# Patient Record
Sex: Female | Born: 1975 | Race: White | Hispanic: No | Marital: Married | Smoking: Current every day smoker
Health system: Southern US, Community
[De-identification: ages and names within clinical notes are randomized; demographics above are authoritative.]

## PROBLEM LIST (undated history)

## (undated) DIAGNOSIS — G932 Benign intracranial hypertension: Secondary | ICD-10-CM

## (undated) DIAGNOSIS — R911 Solitary pulmonary nodule: Secondary | ICD-10-CM

## (undated) DIAGNOSIS — F1010 Alcohol abuse, uncomplicated: Secondary | ICD-10-CM

## (undated) DIAGNOSIS — R5383 Other fatigue: Secondary | ICD-10-CM

## (undated) DIAGNOSIS — H4710 Unspecified papilledema: Secondary | ICD-10-CM

## (undated) DIAGNOSIS — J449 Chronic obstructive pulmonary disease, unspecified: Secondary | ICD-10-CM

## (undated) DIAGNOSIS — G2581 Restless legs syndrome: Secondary | ICD-10-CM

## (undated) DIAGNOSIS — F1911 Other psychoactive substance abuse, in remission: Secondary | ICD-10-CM

## (undated) DIAGNOSIS — K589 Irritable bowel syndrome without diarrhea: Secondary | ICD-10-CM

## (undated) DIAGNOSIS — F419 Anxiety disorder, unspecified: Secondary | ICD-10-CM

## (undated) HISTORY — PX: CHOLECYSTECTOMY: SHX55

## (undated) HISTORY — DX: Other psychoactive substance abuse, in remission: F19.11

## (undated) HISTORY — DX: Chronic obstructive pulmonary disease, unspecified: J44.9

## (undated) HISTORY — DX: Anxiety disorder, unspecified: F41.9

## (undated) HISTORY — PX: TUBAL LIGATION: SHX77

## (undated) HISTORY — PX: DENTAL SURGERY: SHX609

## (undated) HISTORY — DX: Alcohol abuse, uncomplicated: F10.10

## (undated) HISTORY — DX: Other fatigue: R53.83

## (undated) HISTORY — DX: Unspecified papilledema: H47.10

## (undated) HISTORY — DX: Irritable bowel syndrome, unspecified: K58.9

## (undated) HISTORY — DX: Restless legs syndrome: G25.81

## (undated) HISTORY — DX: Benign intracranial hypertension: G93.2

## (undated) HISTORY — PX: OTHER SURGICAL HISTORY: SHX169

## (undated) HISTORY — DX: Solitary pulmonary nodule: R91.1

---

## 2018-09-09 ENCOUNTER — Emergency Department (HOSPITAL_COMMUNITY)
Admission: EM | Admit: 2018-09-09 | Discharge: 2018-09-09 | Disposition: A | Discharge status: Home / Self Care | Payer: Medicaid - Out of State | Attending: Emergency Medicine | Admitting: Emergency Medicine

## 2018-09-09 ENCOUNTER — Encounter (HOSPITAL_COMMUNITY): Payer: Self-pay

## 2018-09-09 ENCOUNTER — Other Ambulatory Visit: Payer: Self-pay

## 2018-09-09 DIAGNOSIS — X17XXXA Contact with hot engines, machinery and tools, initial encounter: Secondary | ICD-10-CM | POA: Insufficient documentation

## 2018-09-09 DIAGNOSIS — Y9289 Other specified places as the place of occurrence of the external cause: Secondary | ICD-10-CM | POA: Insufficient documentation

## 2018-09-09 DIAGNOSIS — Y9389 Activity, other specified: Secondary | ICD-10-CM | POA: Insufficient documentation

## 2018-09-09 DIAGNOSIS — F17210 Nicotine dependence, cigarettes, uncomplicated: Secondary | ICD-10-CM | POA: Insufficient documentation

## 2018-09-09 DIAGNOSIS — Y998 Other external cause status: Secondary | ICD-10-CM | POA: Insufficient documentation

## 2018-09-09 DIAGNOSIS — R197 Diarrhea, unspecified: Secondary | ICD-10-CM | POA: Diagnosis not present

## 2018-09-09 DIAGNOSIS — T24232A Burn of second degree of left lower leg, initial encounter: Secondary | ICD-10-CM | POA: Diagnosis present

## 2018-09-09 DIAGNOSIS — R112 Nausea with vomiting, unspecified: Secondary | ICD-10-CM | POA: Diagnosis not present

## 2018-09-09 DIAGNOSIS — T300 Burn of unspecified body region, unspecified degree: Secondary | ICD-10-CM

## 2018-09-09 HISTORY — DX: Benign intracranial hypertension: G93.2

## 2018-09-09 LAB — COMPREHENSIVE METABOLIC PANEL
ALT: 9 U/L (ref 0–44)
AST: 13 U/L — ABNORMAL LOW (ref 15–41)
Albumin: 3.1 g/dL — ABNORMAL LOW (ref 3.5–5.0)
Alkaline Phosphatase: 91 U/L (ref 38–126)
Anion gap: 7 (ref 5–15)
BUN: <5 mg/dL — ABNORMAL LOW (ref 6–20)
CO2: 26 mmol/L (ref 22–32)
Calcium: 7.6 mg/dL — ABNORMAL LOW (ref 8.9–10.3)
Chloride: 105 mmol/L (ref 98–111)
Creatinine, Ser: 0.67 mg/dL (ref 0.44–1.00)
GFR calc Af Amer: >60 mL/min (ref >60)
GFR calc non Af Amer: >60 mL/min (ref >60)
Glucose, Bld: 100 mg/dL — ABNORMAL HIGH (ref 70–99)
Potassium: 3.1 mmol/L — ABNORMAL LOW (ref 3.5–5.1)
Sodium: 138 mmol/L (ref 135–145)
Total Bilirubin: 0.5 mg/dL (ref 0.3–1.2)
Total Protein: 6 g/dL — ABNORMAL LOW (ref 6.5–8.1)

## 2018-09-09 LAB — CBC WITH DIFFERENTIAL/PLATELET
Abs Immature Granulocytes: 0.03 10*3/uL (ref 0.00–0.07)
Basophils Absolute: 0 10*3/uL (ref 0.0–0.1)
Basophils Relative: 0 %
Eosinophils Absolute: 0.2 10*3/uL (ref 0.0–0.5)
Eosinophils Relative: 2 %
HCT: 36.4 % (ref 36.0–46.0)
Hemoglobin: 11.2 g/dL — ABNORMAL LOW (ref 12.0–15.0)
Immature Granulocytes: 0 %
Lymphocytes Relative: 24 %
Lymphs Abs: 1.9 10*3/uL (ref 0.7–4.0)
MCH: 27 pg (ref 26.0–34.0)
MCHC: 30.8 g/dL (ref 30.0–36.0)
MCV: 87.7 fL (ref 80.0–100.0)
Monocytes Absolute: 0.4 10*3/uL (ref 0.1–1.0)
Monocytes Relative: 5 %
Neutro Abs: 5.5 10*3/uL (ref 1.7–7.7)
Neutrophils Relative %: 69 %
Platelets: 301 10*3/uL (ref 150–400)
RBC: 4.15 MIL/uL (ref 3.87–5.11)
RDW: 16.5 % — ABNORMAL HIGH (ref 11.5–15.5)
WBC: 7.9 10*3/uL (ref 4.0–10.5)
nRBC: 0 % (ref 0.0–0.2)

## 2018-09-09 LAB — URINALYSIS, ROUTINE W REFLEX MICROSCOPIC
Bacteria, UA: NONE SEEN
Bilirubin Urine: NEGATIVE
Glucose, UA: NEGATIVE mg/dL
Ketones, ur: NEGATIVE mg/dL
Leukocytes,Ua: NEGATIVE
Nitrite: NEGATIVE
Protein, ur: NEGATIVE mg/dL
Specific Gravity, Urine: 1.002 — ABNORMAL LOW (ref 1.005–1.030)
pH: 7 (ref 5.0–8.0)

## 2018-09-09 LAB — LIPASE, BLOOD: Lipase: 31 U/L (ref 11–51)

## 2018-09-09 MED ORDER — POTASSIUM CHLORIDE CRYS ER 20 MEQ PO TBCR
20.0000 meq | EXTENDED_RELEASE_TABLET | Freq: Once | ORAL | Status: AC
  Administered 2018-09-09: 20 meq via ORAL
  Filled 2018-09-09: qty 1

## 2018-09-09 MED ORDER — HYDROCODONE-ACETAMINOPHEN 5-325 MG PO TABS: 1.0000 | ORAL_TABLET | ORAL | 0 refills | Status: DC | PRN

## 2018-09-09 MED ORDER — SODIUM CHLORIDE 0.9 % IV BOLUS
1000.0000 mL | Freq: Once | INTRAVENOUS | Status: AC
  Administered 2018-09-09: 1000 mL via INTRAVENOUS

## 2018-09-09 MED ORDER — SILVER SULFADIAZINE 1 % EX CREA
TOPICAL_CREAM | Freq: Once | CUTANEOUS | Status: AC
  Administered 2018-09-09: 1 via TOPICAL
  Filled 2018-09-09: qty 50
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
## 2018-09-09 NOTE — ED Triage Notes (Signed)
Pt reports she has chronic diarrhea and vomiting frequently.  Pt says is seeing a GI specialist in [place].   Says they are checking her pancreas.
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
## 2018-11-02 ENCOUNTER — Emergency Department (HOSPITAL_COMMUNITY): Payer: Medicaid - Out of State

## 2018-11-02 ENCOUNTER — Emergency Department (HOSPITAL_COMMUNITY)
Admission: EM | Admit: 2018-11-02 | Discharge: 2018-11-02 | Disposition: A | Discharge status: Home / Self Care | Payer: Medicaid - Out of State | Attending: Emergency Medicine | Admitting: Emergency Medicine

## 2018-11-02 ENCOUNTER — Other Ambulatory Visit: Payer: Self-pay

## 2018-11-02 ENCOUNTER — Encounter (HOSPITAL_COMMUNITY): Payer: Self-pay

## 2018-11-02 DIAGNOSIS — F17200 Nicotine dependence, unspecified, uncomplicated: Secondary | ICD-10-CM | POA: Insufficient documentation

## 2018-11-02 DIAGNOSIS — M25561 Pain in right knee: Secondary | ICD-10-CM | POA: Diagnosis not present

## 2018-11-02 DIAGNOSIS — Z91040 Latex allergy status: Secondary | ICD-10-CM | POA: Insufficient documentation

## 2018-11-02 DIAGNOSIS — Z79899 Other long term (current) drug therapy: Secondary | ICD-10-CM | POA: Diagnosis not present

## 2018-11-02 MED ORDER — NAPROXEN 500 MG PO TABS: 500.0000 mg | ORAL_TABLET | Freq: Two times a day (BID) | ORAL | 0 refills | Status: DC

## 2018-11-02 MED ORDER — IBUPROFEN 800 MG PO TABS
800.0000 mg | ORAL_TABLET | Freq: Once | ORAL | Status: AC
  Administered 2018-11-02: 800 mg via ORAL
  Filled 2018-11-02: qty 1

## 2018-11-02 NOTE — Discharge Instructions (Signed)
Take naproxen 2 times a day with meals.  Do not take other anti-inflammatories at the same time (Advil, Motrin, ibuprofen, Aleve). You may supplement with Tylenol if you need further pain control. Use ice packs , 20 minutes at a time, 3 times a day.  Use the knee immobilizer and crutches when walking.  Keep your leg elevated.  Follow up with the orthopedic doctor in 1 week if your pain is not improving Return to the ER with any new, worsening, or concerning symptoms.
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
## 2018-11-02 NOTE — ED Provider Notes (Signed)
[facility] EMERGENCY DEPARTMENT Provider Note   CSN: [id number] Arrival date & time: 11/02/18  1925     History   Chief Complaint No chief complaint on file.   HPI Lacey Simon is a 43 y.o. female presenting for evaluation of right knee pain.  Patient states she was going up some stairs yesterday when she felt her knee lock and then pop.  She did not have significant amount of pain or swelling immediately afterwards, however several hours later she had increased pain.  When she woke up this morning, her knee was more swollen and tender.  She has not taken anything for this including Tylenol or ibuprofen.  She denies previous history of knee problems.  She denies numbness or tingling.  Pain is worse with palpation and movement.  Nothing makes it better.  She does not have an orthopedic doctor.  She denies pain or injury elsewhere.     HPI  Past Medical History:  Diagnosis Date  . Pseudotumor cerebri     There are no active problems to display for this patient.   Past Surgical History:  Procedure Laterality Date  . CESAREAN SECTION    . CHOLECYSTECTOMY    . TUBAL LIGATION       OB History   No obstetric history on file.      Home Medications    Prior to Admission medications   Medication Sig Start Date End Date Taking? Authorizing Provider  HYDROcodone-acetaminophen (NORCO/VICODIN) 5-325 MG tablet Take 1 tablet by mouth every 4 (four) hours as needed. 09/09/18   Evalee Jefferson, PA-C  naproxen (NAPROSYN) 500 MG tablet Take 1 tablet (500 mg total) by mouth 2 (two) times daily with a meal. 11/02/18   Trinnity Breunig, PA-C  pramipexole (MIRAPEX) 0.5 MG tablet Take 1 tablet by mouth every evening. 09/02/18   [provider]  promethazine (PHENERGAN) 25 MG tablet Take 1 tablet by mouth 2 (two) times daily as needed. 03/04/18   [provider]  sucralfate (CARAFATE) 1 g tablet Take 1 tablet by mouth 2 (two) times a day. 04/09/18   [provider]    Family  History History reviewed. No pertinent family history.  Social History Social History   Tobacco Use  . Smoking status: Current Every Day Smoker  . Smokeless tobacco: Never Used  Substance Use Topics  . Alcohol use: Never    Frequency: Never  . Drug use: Never     Allergies   Penicillins, Latex, Sulfa antibiotics, and Erythromycin   Review of Systems Review of Systems  Musculoskeletal: Positive for arthralgias and joint swelling.  Hematological: Does not bruise/bleed easily.     Physical Exam Updated Vital Signs BP (!) 145/86 (BP Location: Right Arm)   Pulse 75   Temp 98.3 F (36.8 C) (Oral)   Resp 18   Ht 5\' 3"  (1.6 m)   Wt 61.2 kg   LMP 10/05/2018   SpO2 100%   BMI 23.91 kg/m   Physical Exam Vitals signs and nursing note reviewed.  Constitutional:      General: She is not in acute distress.    Appearance: She is well-developed.  HENT:     Head: Normocephalic and atraumatic.  Neck:     Musculoskeletal: Normal range of motion.  Pulmonary:     Effort: Pulmonary effort is normal.  Abdominal:     General: There is no distension.  Musculoskeletal:        General: Swelling and tenderness present.  Comments: Mild swelling noted of the right knee.  No erythema or warmth.  Tenderness palpation of the anterior and medial knee.  No tenderness palpation of the calf or thigh.  Pedal pulses intact. Decreased range of motion of the knee due to pain.  Increased pain with varus stress.   Skin:    General: Skin is warm.     Capillary Refill: Capillary refill takes less than 2 seconds.     Findings: No rash.  Neurological:     Mental Status: She is alert and oriented to person, place, and time.      ED Treatments / Results  Labs (all labs ordered are listed, but only abnormal results are displayed) Labs Reviewed - No data to display  EKG None  Radiology Dg Knee Complete 4 Views Right  Result Date: 11/02/2018 CLINICAL DATA:  43 year old female with right  knee pain. EXAM: RIGHT KNEE - COMPLETE 4+ VIEW COMPARISON:  None. FINDINGS: No evidence of fracture, dislocation, or joint effusion. No evidence of arthropathy or other focal bone abnormality. Soft tissues are unremarkable. IMPRESSION: Negative. Electronically Signed   By: Elgie CollardArash  Radparvar M.D.   On: 11/02/2018 20:33    Procedures Procedures (including critical care time)  Medications Ordered in ED Medications  ibuprofen (ADVIL) tablet 800 mg (800 mg Oral Given 11/02/18 2140)     Initial Impression / Assessment and Plan / ED Course  I have reviewed the triage vital signs and the nursing notes.  Pertinent labs & imaging results that were available during my care of the patient were reviewed by me and considered in my medical decision making (see chart for details).        Patient presenting for evaluation of knee pain.  Physical exam reassuring, she is neurovascularly intact.  However, his knee is swollen and tender, will obtain x-rays for further evaluation.  X-rays read interpreted by me, no fracture dislocation.  Discussed possibility of strain for sprain versus ligamentous versus meniscal injury.  Discussed symptomatic treatment with knee immobilization, crutches, Tylenol, ibuprofen, rest, ice, elevation.  Encourage follow-up with orthopedics in 1 week if symptoms not improving.  At this time, patient appears safe for discharge.  Return precautions given.  Patient states she understands agrees plan.   Final Clinical Impressions(s) / ED Diagnoses   Final diagnoses:  Acute pain of right knee    ED Discharge Orders         Ordered    naproxen (NAPROSYN) 500 MG tablet  2 times daily with meals     11/02/18 2105           Jakhi Dishman, PA-C 11/02/18 2211    Pricilla LovelessGoldston, Scott, MD 11/02/18 2315

## 2019-12-11 ENCOUNTER — Other Ambulatory Visit: Payer: Self-pay

## 2019-12-11 ENCOUNTER — Ambulatory Visit (INDEPENDENT_AMBULATORY_CARE_PROVIDER_SITE_OTHER): Payer: Self-pay | Admitting: Podiatry

## 2019-12-11 ENCOUNTER — Encounter: Payer: Self-pay | Admitting: Podiatry

## 2019-12-11 DIAGNOSIS — M7752 Other enthesopathy of left foot: Secondary | ICD-10-CM

## 2019-12-11 DIAGNOSIS — Q828 Other specified congenital malformations of skin: Secondary | ICD-10-CM

## 2019-12-15 ENCOUNTER — Encounter: Payer: Self-pay | Admitting: Podiatry

## 2019-12-15 NOTE — Progress Notes (Signed)
Subjective:  Patient ID: Lacey Simon, female    DOB: 1975-12-18,  MRN: [id number]  Chief Complaint  Patient presents with  . Callouses    Bilateral callouses painful when i walk     44 y.o. female presents with the above complaint.  Patient presents with complaint of left submetatarsal 5 porokeratosis.  Patient states it painful to walk on.  She states that she has tried everything to help with it.  She has tried offloading it she is tried over-the-counter insoles.  She is tried Economist modification.  She has not seen anyone else prior to seeing me.  She would like to discuss treatment options.  She denies any other acute complaints.  She has not seen anyone else prior to seeing me.   Review of Systems: Negative except as noted in the HPI. Denies N/V/F/Ch.  Past Medical History:  Diagnosis Date  . Pseudotumor cerebri     Current Outpatient Medications:  .  diclofenac (VOLTAREN) 75 MG EC tablet, Take by mouth., Disp: , Rfl:  .  diphenhydrAMINE (BENADRYL) 25 mg capsule, Take by mouth., Disp: , Rfl:  .  furosemide (LASIX) 20 MG tablet, TAKE 1 TABLET BY MOUTH ONCE DAILY FOR 14 DAYS, Disp: , Rfl:  .  gabapentin (NEURONTIN) 300 MG capsule, Take 1 capsule by mouth in the morning and 2 capsules nightly, Disp: , Rfl:  .  omeprazole (PRILOSEC) 40 MG capsule, Take 1 capsule by mouth daily., Disp: , Rfl:  .  HYDROcodone-acetaminophen (NORCO/VICODIN) 5-325 MG tablet, Take 1 tablet by mouth every 4 (four) hours as needed., Disp: 10 tablet, Rfl: 0 .  naproxen (NAPROSYN) 500 MG tablet, Take 1 tablet (500 mg total) by mouth 2 (two) times daily with a meal., Disp: 21 tablet, Rfl: 0 .  pramipexole (MIRAPEX) 0.5 MG tablet, Take 1 tablet by mouth every evening., Disp: , Rfl:  .  promethazine (PHENERGAN) 25 MG tablet, Take 1 tablet by mouth 2 (two) times daily as needed., Disp: , Rfl:  .  sucralfate (CARAFATE) 1 g tablet, Take 1 tablet by mouth 2 (two) times a day., Disp: , Rfl:   Social History     Tobacco Use  Smoking Status Current Every Day Smoker  Smokeless Tobacco Never Used    Allergies  Allergen Reactions  . Penicillins Shortness Of Breath  . Latex   . Sulfa Antibiotics   . Erythromycin Palpitations   Objective:  There were no vitals filed for this visit. There is no height or weight on file to calculate BMI. Constitutional Well developed. Well nourished.  Vascular Dorsalis pedis pulses palpable bilaterally. Posterior tibial pulses palpable bilaterally. Capillary refill normal to all digits.  No cyanosis or clubbing noted. Pedal hair growth normal.  Neurologic Normal speech. Oriented to person, place, and time. Epicritic sensation to light touch grossly present bilaterally.  Dermatologic Nails well groomed and normal in appearance. No open wounds. No skin lesions.  Orthopedic:  Pain on palpation of the left submetatarsal 5 hyperkeratotic lesion.  Upon debridement no pinpoint bleeding noted.  Pain with range of motion of the left digital metatarsophalangeal joint.  No intra-articular pain noted.  No flexor or extensor tendinitis noted.   Radiographs: None Assessment:   1. Capsulitis of metatarsophalangeal (MTP) joint of left foot   2. Porokeratosis    Plan:  Patient was evaluated and treated and all questions answered.  Left fifth MTP capsulitis with underlying porokeratosis -I explained to the patient the etiology of capsulitis and various treatment  options were discussed.  I also explained to the patient the etiology of porokeratosis and various options were discussed.  I believe patient will benefit from aggressive debridement of the lesion followed by excision of the central nucleated core.  Patient was would like to proceed with that.  Also believe she will benefit from a steroid injection to help decrease acute inflammatory component associated pain.  Patient agrees with the plan would like to receive a steroid injection as well. -A steroid injection was  performed at left fifth MTPJ joint using 1% plain Lidocaine and 10 mg of Kenalog. This was well tolerated.   No follow-ups on file.

## 2020-01-08 ENCOUNTER — Other Ambulatory Visit: Payer: Self-pay

## 2020-01-08 ENCOUNTER — Emergency Department (HOSPITAL_COMMUNITY): Admission: EM | Admit: 2020-01-08 | Discharge: 2020-01-08 | Discharge status: Against Medical Advice | Payer: Self-pay

## 2020-01-27 ENCOUNTER — Ambulatory Visit: Payer: Medicaid Other | Admitting: Podiatry

## 2020-02-02 IMAGING — DX DG KNEE COMPLETE 4+V*R*
4 series · 4 of 4 positions shown · non-contrast
Comparison: None.

CLINICAL DATA: 42-year-old female with right knee pain.

EXAM:
RIGHT KNEE - COMPLETE 4+ VIEW

[knee ap]
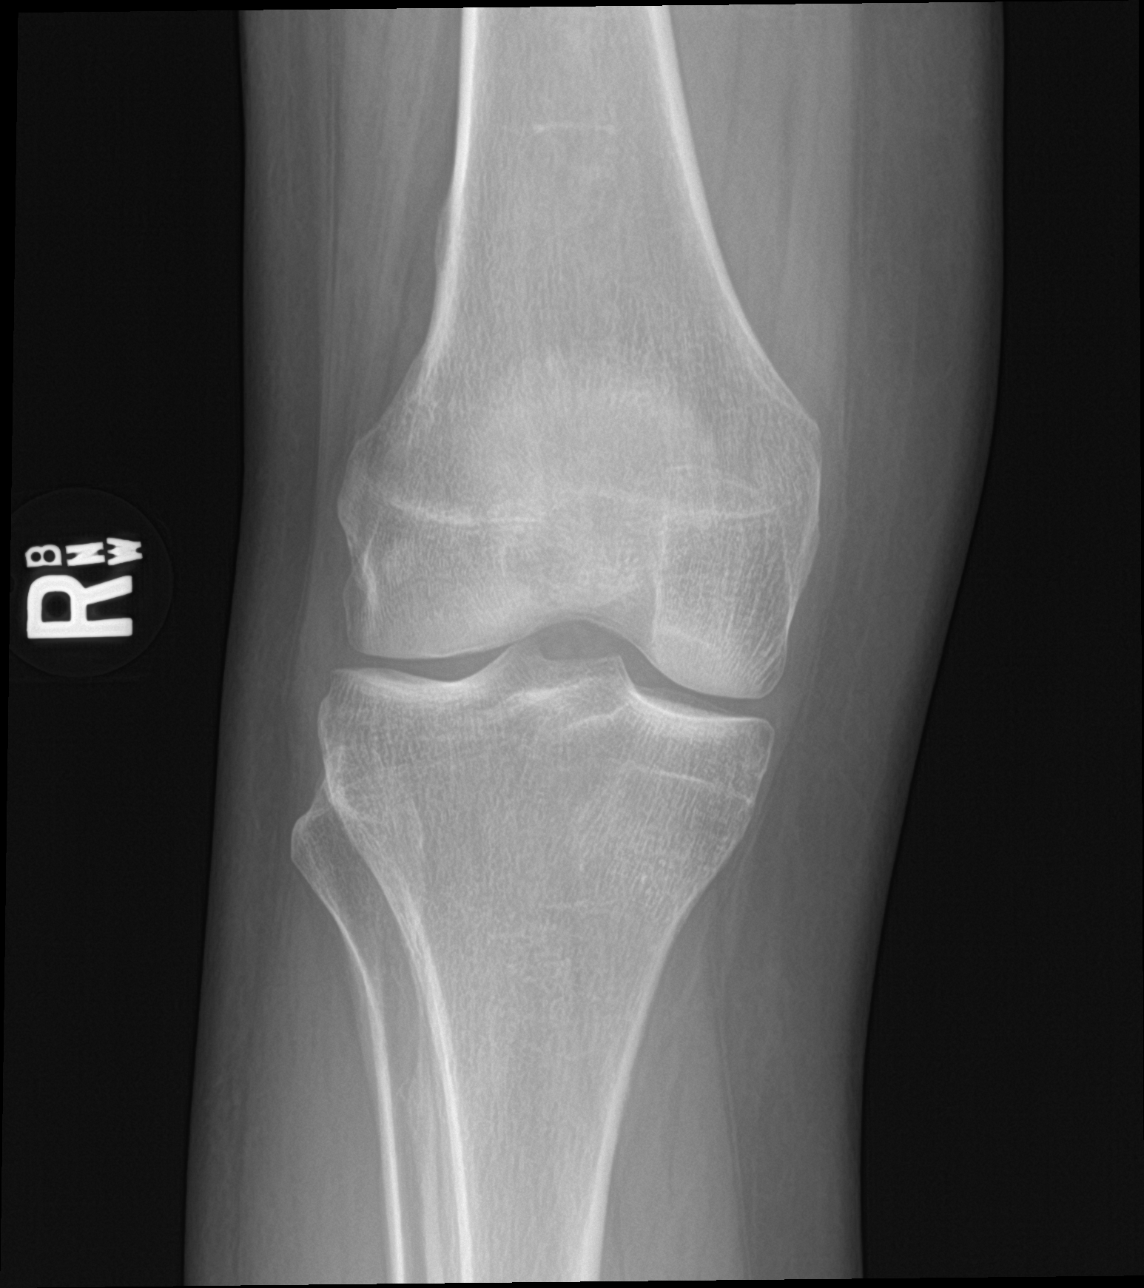

[tunnel]
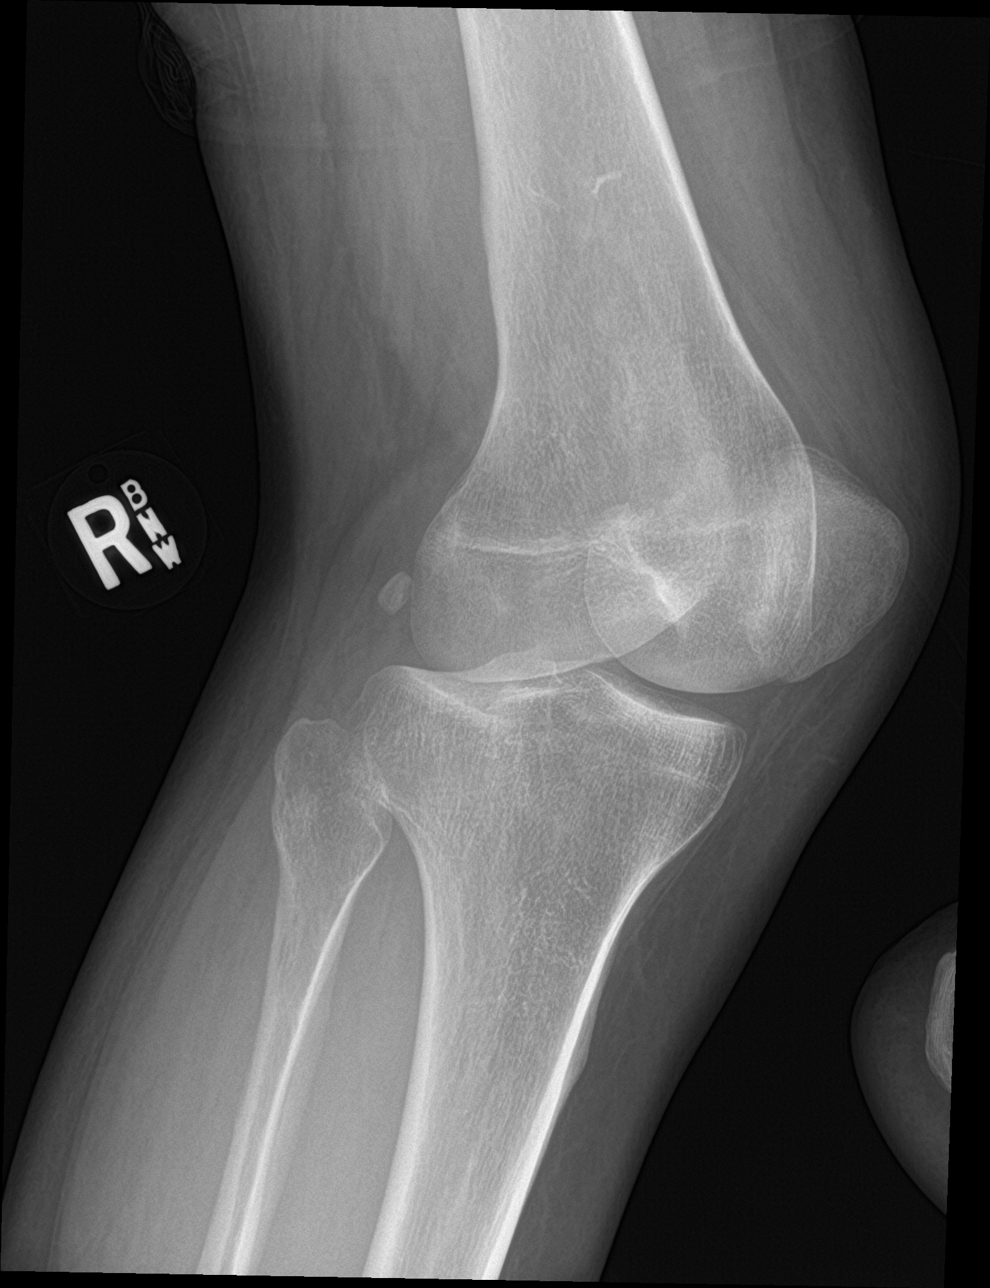

[knee lat]
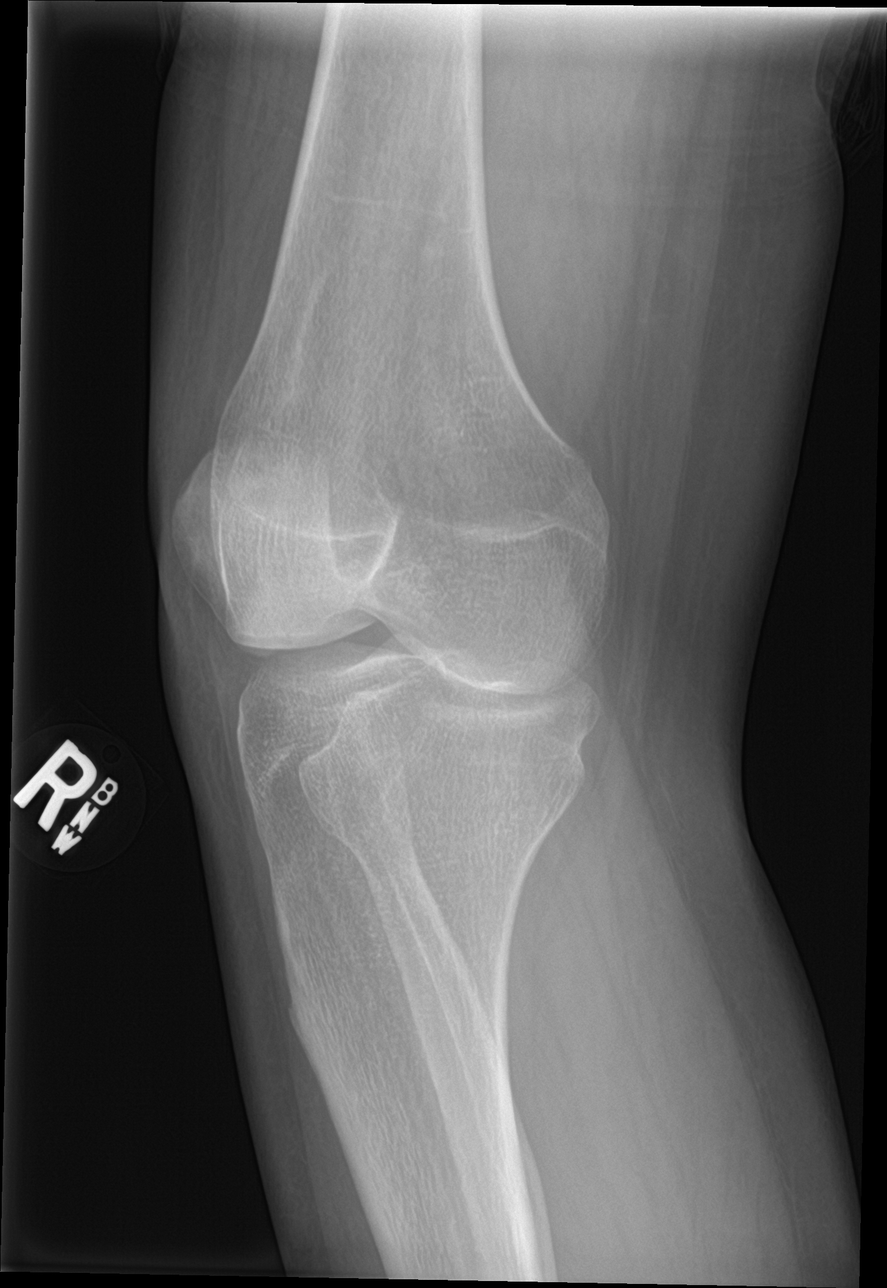

[knee sunrise]
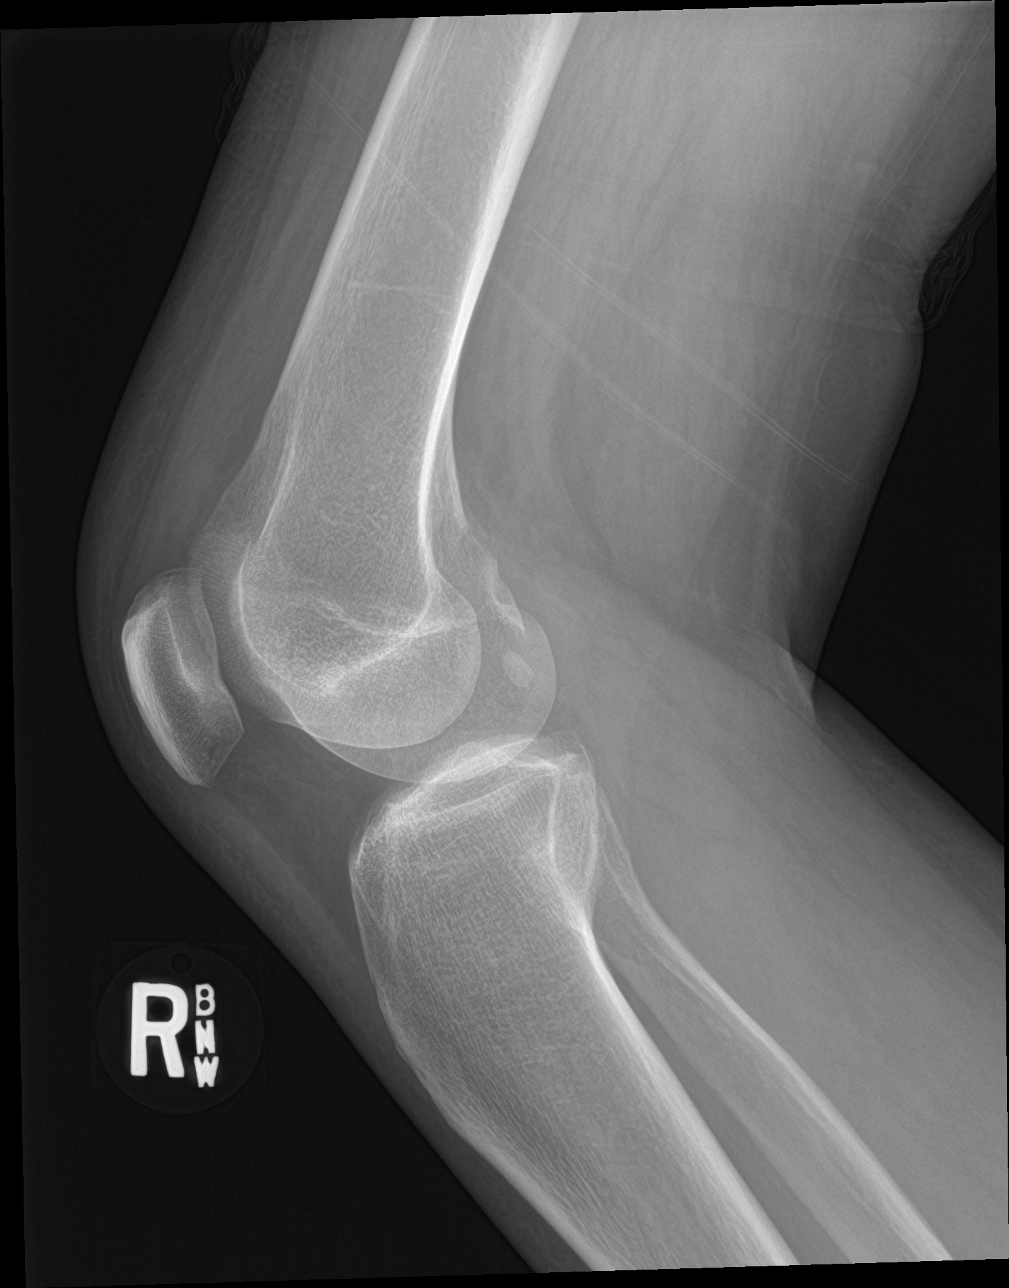

[4 of 4 positions shown; findings below may reference images not displayed]

FINDINGS: No evidence of fracture, dislocation, or joint effusion. No evidence
of arthropathy or other focal bone abnormality. Soft tissues are
unremarkable.
IMPRESSION: Negative.

## 2020-04-20 ENCOUNTER — Ambulatory Visit: Payer: Medicaid Other | Admitting: Podiatry

## 2020-04-25 ENCOUNTER — Ambulatory Visit: Payer: Self-pay | Admitting: Podiatry

## 2020-04-26 ENCOUNTER — Ambulatory Visit: Payer: Medicaid Other | Admitting: Internal Medicine

## 2020-08-26 ENCOUNTER — Ambulatory Visit: Payer: Self-pay | Admitting: Podiatry

## 2020-09-02 ENCOUNTER — Ambulatory Visit: Payer: Self-pay

## 2020-09-02 NOTE — Telephone Encounter (Signed)
Reason for Disposition  SEVERE vaginal bleeding (e.g., soaking 2 pads or tampons per hour and present 2 or more hours; 1 menstrual cup every 2 hours)  Answer Assessment - Initial Assessment Questions 1. AMOUNT: "Describe the bleeding that you are having."    - SPOTTING: spotting, or pinkish / brownish mucous discharge; does not fill panty liner or pad    - MILD:  less than 1 pad / hour; less than patient's usual menstrual bleeding   - MODERATE: 1-2 pads / hour; 1 menstrual cup every 6 hours; small-medium blood clots (e.g., pea, grape, small coin)   - SEVERE: soaking 2 or more pads/hour for 2 or more hours; 1 menstrual cup every 2 hours; bleeding not contained by pads or continuous red blood from vagina; large blood clots (e.g., golf ball, large coin)      Moderate 2. ONSET: "When did the bleeding begin?" "Is it continuing now?"     2 weeks ago 3. MENSTRUAL PERIOD: "When was the last normal menstrual period?" "How is this different than your period?"     Very irregular 4. REGULARITY: "How regular are your periods?"     Irregular 5. ABDOMINAL PAIN: "Do you have any pain?" "How bad is the pain?"  (e.g., Scale 1-10; mild, moderate, or severe)   - MILD (1-3): doesn't interfere with normal activities, abdomen soft and not tender to touch    - MODERATE (4-7): interferes with normal activities or awakens from sleep, abdomen tender to touch    - SEVERE (8-10): excruciating pain, doubled over, unable to do any normal activities      Comes and goes 7 6. PREGNANCY: "Could you be pregnant?" "Are you sexually active?" "Did you recently give birth?"     No 7. BREASTFEEDING: "Are you breastfeeding?"     No 8. HORMONES: "Are you taking any hormone medications, prescription or OTC?" (e.g., birth control pills, estrogen)     No 9. BLOOD THINNERS: "Do you take any blood thinners?" (e.g., Coumadin/warfarin, Pradaxa/dabigatran, aspirin)     No 10. CAUSE: "What do you think is causing the bleeding?" (e.g.,  recent gyn surgery, recent gyn procedure; known bleeding disorder, cervical cancer, polycystic ovarian disease, fibroids)         Unsure 11. HEMODYNAMIC STATUS: "Are you weak or feeling lightheaded?" If Yes, ask: "Can you stand and walk normally?"        Yes 12. OTHER SYMPTOMS: "What other symptoms are you having with the bleeding?" (e.g., passed tissue, vaginal discharge, fever, menstrual-type cramps)       No  Protocols used: Vaginal Bleeding - Abnormal-A-AH

## 2020-09-02 NOTE — Telephone Encounter (Signed)
Pt. Currently has Medicaid and no PCP or OB/GYN. Having heavy vaginal bleeding and weakness. Will go to ED.

## 2020-09-12 ENCOUNTER — Encounter (HOSPITAL_BASED_OUTPATIENT_CLINIC_OR_DEPARTMENT_OTHER): Payer: Self-pay | Admitting: Emergency Medicine

## 2020-09-12 ENCOUNTER — Other Ambulatory Visit: Payer: Self-pay

## 2020-09-12 ENCOUNTER — Emergency Department (HOSPITAL_BASED_OUTPATIENT_CLINIC_OR_DEPARTMENT_OTHER)
Admission: EM | Admit: 2020-09-12 | Discharge: 2020-09-12 | Disposition: A | Discharge status: Home / Self Care | Payer: Medicaid Other | Attending: Emergency Medicine | Admitting: Emergency Medicine

## 2020-09-12 DIAGNOSIS — R5383 Other fatigue: Secondary | ICD-10-CM | POA: Insufficient documentation

## 2020-09-12 DIAGNOSIS — F17200 Nicotine dependence, unspecified, uncomplicated: Secondary | ICD-10-CM | POA: Insufficient documentation

## 2020-09-12 DIAGNOSIS — N938 Other specified abnormal uterine and vaginal bleeding: Secondary | ICD-10-CM | POA: Insufficient documentation

## 2020-09-12 DIAGNOSIS — R531 Weakness: Secondary | ICD-10-CM | POA: Insufficient documentation

## 2020-09-12 DIAGNOSIS — R102 Pelvic and perineal pain: Secondary | ICD-10-CM | POA: Insufficient documentation

## 2020-09-12 DIAGNOSIS — Z91040 Latex allergy status: Secondary | ICD-10-CM | POA: Insufficient documentation

## 2020-09-12 LAB — CBC WITH DIFFERENTIAL/PLATELET
Abs Immature Granulocytes: 0.02 10*3/uL (ref 0.00–0.07)
Basophils Absolute: 0 10*3/uL (ref 0.0–0.1)
Basophils Relative: 0 %
Eosinophils Absolute: 0.2 10*3/uL (ref 0.0–0.5)
Eosinophils Relative: 2 %
HCT: 43.7 % (ref 36.0–46.0)
Hemoglobin: 14.3 g/dL (ref 12.0–15.0)
Immature Granulocytes: 0 %
Lymphocytes Relative: 24 %
Lymphs Abs: 2.2 10*3/uL (ref 0.7–4.0)
MCH: 30.8 pg (ref 26.0–34.0)
MCHC: 32.7 g/dL (ref 30.0–36.0)
MCV: 94 fL (ref 80.0–100.0)
Monocytes Absolute: 0.5 10*3/uL (ref 0.1–1.0)
Monocytes Relative: 6 %
Neutro Abs: 6.3 10*3/uL (ref 1.7–7.7)
Neutrophils Relative %: 68 %
Platelets: 226 10*3/uL (ref 150–400)
RBC: 4.65 MIL/uL (ref 3.87–5.11)
RDW: 13.8 % (ref 11.5–15.5)
WBC: 9.2 10*3/uL (ref 4.0–10.5)
nRBC: 0 % (ref 0.0–0.2)

## 2020-09-12 LAB — WET PREP, GENITAL
Clue Cells Wet Prep HPF POC: NONE SEEN
Sperm: NONE SEEN
Trich, Wet Prep: NONE SEEN
Yeast Wet Prep HPF POC: NONE SEEN

## 2020-09-12 LAB — URINALYSIS, ROUTINE W REFLEX MICROSCOPIC
Bilirubin Urine: NEGATIVE
Glucose, UA: NEGATIVE mg/dL
Ketones, ur: NEGATIVE mg/dL
Leukocytes,Ua: NEGATIVE
Nitrite: NEGATIVE
RBC / HPF: >50 RBC/hpf — ABNORMAL HIGH (ref 0–5)
Specific Gravity, Urine: <1.005 — ABNORMAL LOW (ref 1.005–1.030)
pH: 6.5 (ref 5.0–8.0)

## 2020-09-12 LAB — PREGNANCY, URINE: Preg Test, Ur: NEGATIVE

## 2020-09-12 LAB — BASIC METABOLIC PANEL
Anion gap: 9 (ref 5–15)
BUN: 5 mg/dL — ABNORMAL LOW (ref 6–20)
CO2: 25 mmol/L (ref 22–32)
Calcium: 8.3 mg/dL — ABNORMAL LOW (ref 8.9–10.3)
Chloride: 105 mmol/L (ref 98–111)
Creatinine, Ser: 0.72 mg/dL (ref 0.44–1.00)
GFR, Estimated: >60 mL/min (ref >60)
Glucose, Bld: 85 mg/dL (ref 70–99)
Potassium: 3.5 mmol/L (ref 3.5–5.1)
Sodium: 139 mmol/L (ref 135–145)

## 2020-09-12 MED ORDER — MEDROXYPROGESTERONE ACETATE 10 MG PO TABS: 20.0000 mg | ORAL_TABLET | Freq: Three times a day (TID) | ORAL | 0 refills | Status: DC

## 2020-09-12 NOTE — ED Provider Notes (Signed)
[facility] EMERGENCY DEPT Provider Note   CSN: [id number] Arrival date & time: 09/12/20  1109     History Chief Complaint  Patient presents with   Vaginal Bleeding    Lacey Simon is a 45 y.o. female.  Pt presents to the ED today with vaginal bleeding.  Pt said she has had vaginal bleeding since 6/24.  She said it has worsened over the past 2 weeks.  The pt said she has some cramps.  She feels tired and weak.  She went to the ED in [place] on 6/28 because she thought the blood was coming from her urine.  Hgb then was nl.  Pt does not have an obgyn.      Past Medical History:  Diagnosis Date   Pseudotumor cerebri     There are no problems to display for this patient.   Past Surgical History:  Procedure Laterality Date   CESAREAN SECTION     CHOLECYSTECTOMY     TUBAL LIGATION       OB History   No obstetric history on file.     History reviewed. No pertinent family history.  Social History   Tobacco Use   Smoking status: Every Day   Smokeless tobacco: Never  Substance Use Topics   Alcohol use: Never   Drug use: Never    Home Medications Prior to Admission medications   Medication Sig Start Date End Date Taking? Authorizing Provider  medroxyPROGESTERone (PROVERA) 10 MG tablet Take 2 tablets (20 mg total) by mouth 3 (three) times daily for 7 days. 09/12/20 09/19/20 Yes Jacalyn Lefevre, MD  diclofenac (VOLTAREN) 75 MG EC tablet Take by mouth. 09/18/19   [provider]  diphenhydrAMINE (BENADRYL) 25 mg capsule Take by mouth. 09/18/19   [provider]  furosemide (LASIX) 20 MG tablet TAKE 1 TABLET BY MOUTH ONCE DAILY FOR 14 DAYS 07/19/17   [provider]  gabapentin (NEURONTIN) 300 MG capsule Take 1 capsule by mouth in the morning and 2 capsules nightly 08/18/19   [provider]  HYDROcodone-acetaminophen (NORCO/VICODIN) 5-325 MG tablet Take 1 tablet by mouth every 4 (four) hours as needed. 09/09/18   Burgess Amor,  PA-C  naproxen (NAPROSYN) 500 MG tablet Take 1 tablet (500 mg total) by mouth 2 (two) times daily with a meal. 11/02/18   Caccavale, Sophia, PA-C  omeprazole (PRILOSEC) 40 MG capsule Take 1 capsule by mouth daily. 08/21/18   [provider]  pramipexole (MIRAPEX) 0.5 MG tablet Take 1 tablet by mouth every evening. 09/02/18   [provider]  promethazine (PHENERGAN) 25 MG tablet Take 1 tablet by mouth 2 (two) times daily as needed. 03/04/18   [provider]  sucralfate (CARAFATE) 1 g tablet Take 1 tablet by mouth 2 (two) times a day. 04/09/18   [provider]    Allergies    Penicillins, Latex, Sulfa antibiotics, and Erythromycin  Review of Systems   Review of Systems  Genitourinary:  Positive for vaginal bleeding.  All other systems reviewed and are negative.  Physical Exam Updated Vital Signs BP 116/78   Pulse 81   Temp 98.5 F (36.9 C) (Oral)   Resp 16   Ht 5\' 3"  (1.6 m)   Wt 60.8 kg   SpO2 95%   BMI 23.74 kg/m   Physical Exam Vitals and nursing note reviewed. Exam conducted with a chaperone present.  Constitutional:      Appearance: Normal appearance.  HENT:     Head: Normocephalic  and atraumatic.     Right Ear: External ear normal.     Left Ear: External ear normal.     Nose: Nose normal.     Mouth/Throat:     Mouth: Mucous membranes are moist.     Pharynx: Oropharynx is clear.  Eyes:     Extraocular Movements: Extraocular movements intact.     Conjunctiva/sclera: Conjunctivae normal.     Pupils: Pupils are equal, round, and reactive to light.  Cardiovascular:     Rate and Rhythm: Normal rate and regular rhythm.     Pulses: Normal pulses.     Heart sounds: Normal heart sounds.  Pulmonary:     Effort: Pulmonary effort is normal.     Breath sounds: Normal breath sounds.  Abdominal:     General: Abdomen is flat. Bowel sounds are normal.     Palpations: Abdomen is soft.  Genitourinary:    Vagina: Bleeding present.     Cervix:  Cervical bleeding present.  Musculoskeletal:        General: Normal range of motion.     Cervical back: Normal range of motion and neck supple.  Skin:    General: Skin is warm.     Capillary Refill: Capillary refill takes less than 2 seconds.  Neurological:     General: No focal deficit present.     Mental Status: She is alert and oriented to person, place, and time.  Psychiatric:        Mood and Affect: Mood normal.        Behavior: Behavior normal.        Thought Content: Thought content normal.        Judgment: Judgment normal.    ED Results / Procedures / Treatments   Labs (all labs ordered are listed, but only abnormal results are displayed) Labs Reviewed  WET PREP, GENITAL - Abnormal; Notable for the following components:      Result Value   WBC, Wet Prep HPF POC FEW (*)    All other components within normal limits  BASIC METABOLIC PANEL - Abnormal; Notable for the following components:   BUN 5 (*)    Calcium 8.3 (*)    All other components within normal limits  URINALYSIS, ROUTINE W REFLEX MICROSCOPIC - Abnormal; Notable for the following components:   Color, Urine BROWN (*)    Specific Gravity, Urine <1.005 (*)    Hgb urine dipstick LARGE (*)    Protein, ur TRACE (*)    RBC / HPF >50 (*)    All other components within normal limits  CBC WITH DIFFERENTIAL/PLATELET  PREGNANCY, URINE  GC/CHLAMYDIA PROBE AMP ([place]) NOT AT [facility]    EKG None  Radiology No results found.  Procedures Procedures   Medications Ordered in ED Medications - No data to display  ED Course  I have reviewed the triage vital signs and the nursing notes.  Pertinent labs & imaging results that were available during my care of the patient were reviewed by me and considered in my medical decision making (see chart for details).    MDM Rules/Calculators/A&P                          Hgb is nl.  Pt with heavy bleeding for a month.  Pt is started on provera.  [facility]  Return if worse. Final Clinical Impression(s) / ED Diagnoses Final diagnoses:  DUB (dysfunctional  uterine bleeding)    Rx / DC Orders ED Discharge Orders          Ordered    medroxyPROGESTERone (PROVERA) 10 MG tablet  3 times daily        09/12/20 1324             Jacalyn Lefevre, MD 09/12/20 1326

## 2020-09-12 NOTE — ED Notes (Signed)
Patient verbalizes understanding of discharge instructions. Opportunity for questioning and answers were provided. Patient discharged from ED.  °

## 2020-09-12 NOTE — ED Triage Notes (Signed)
Pt arrives to ED with c/o of vaginal bleeding since 6/24. Pt reports the vaginal bleeding has worsened over the last x2 weeks. Pt reports constant achy/cramping pain to pelvic area. Pt now c/o of lethargy and lightheadedness.

## 2020-09-13 LAB — GC/CHLAMYDIA PROBE AMP (~~LOC~~) NOT AT ARMC
Chlamydia: NEGATIVE
Comment: NEGATIVE
Comment: NORMAL
Neisseria Gonorrhea: NEGATIVE

## 2020-09-14 ENCOUNTER — Encounter: Payer: Medicaid Other | Admitting: Adult Health

## 2020-12-27 DIAGNOSIS — S83241D Other tear of medial meniscus, current injury, right knee, subsequent encounter: Secondary | ICD-10-CM | POA: Diagnosis not present

## 2020-12-27 DIAGNOSIS — M79604 Pain in right leg: Secondary | ICD-10-CM | POA: Diagnosis not present

## 2020-12-27 DIAGNOSIS — M25461 Effusion, right knee: Secondary | ICD-10-CM | POA: Diagnosis not present

## 2020-12-27 DIAGNOSIS — M25561 Pain in right knee: Secondary | ICD-10-CM | POA: Diagnosis not present

## 2020-12-27 DIAGNOSIS — G932 Benign intracranial hypertension: Secondary | ICD-10-CM | POA: Diagnosis not present

## 2020-12-27 DIAGNOSIS — M25361 Other instability, right knee: Secondary | ICD-10-CM | POA: Diagnosis not present

## 2020-12-27 DIAGNOSIS — M79605 Pain in left leg: Secondary | ICD-10-CM | POA: Diagnosis not present

## 2020-12-27 DIAGNOSIS — G2581 Restless legs syndrome: Secondary | ICD-10-CM | POA: Diagnosis not present

## 2020-12-27 DIAGNOSIS — G8929 Other chronic pain: Secondary | ICD-10-CM | POA: Diagnosis not present

## 2021-01-10 ENCOUNTER — Encounter: Payer: Self-pay | Admitting: Neurology

## 2021-01-28 ENCOUNTER — Encounter (HOSPITAL_COMMUNITY): Payer: Self-pay | Admitting: Anesthesiology

## 2021-01-28 ENCOUNTER — Inpatient Hospital Stay: Admit: 2021-01-28 | Payer: Medicaid Other | Admitting: Urology

## 2021-01-28 ENCOUNTER — Encounter (HOSPITAL_COMMUNITY): Payer: Self-pay | Admitting: Urology

## 2021-01-28 SURGERY — CYSTOSCOPY, WITH STENT INSERTION
Anesthesia: General | Laterality: Right

## 2021-01-28 NOTE — Anesthesia Preprocedure Evaluation (Deleted)
Anesthesia Evaluation  Patient identified by MRN, date of birth, ID band Patient awake    Reviewed: Allergy & Precautions, H&P , NPO status , Patient's Chart, lab work & pertinent test results, reviewed documented beta blocker date and time   Airway Mallampati: II  TM Distance: >3 FB Neck ROM: full    Dental no notable dental hx.    Pulmonary neg pulmonary ROS, Current Smoker,    Pulmonary exam normal breath sounds clear to auscultation       Cardiovascular Exercise Tolerance: Good negative cardio ROS   Rhythm:regular Rate:Normal     Neuro/Psych negative neurological ROS  negative psych ROS   GI/Hepatic negative GI ROS, Neg liver ROS,   Endo/Other  negative endocrine ROS  Renal/GU negative Renal ROS  negative genitourinary   Musculoskeletal   Abdominal   Peds  Hematology negative hematology ROS (+)   Anesthesia Other Findings   Reproductive/Obstetrics negative OB ROS                             Anesthesia Physical Anesthesia Plan  ASA: 3 and emergent  Anesthesia Plan: General, General ETT, Rapid Sequence and Cricoid Pressure   Post-op Pain Management:    Induction:   PONV Risk Score and Plan: Ondansetron  Airway Management Planned:   Additional Equipment:   Intra-op Plan:   Post-operative Plan:   Informed Consent: I have reviewed the patients History and Physical, chart, labs and discussed the procedure including the risks, benefits and alternatives for the proposed anesthesia with the patient or authorized representative who has indicated his/her understanding and acceptance.     Dental Advisory Given  Plan Discussed with: CRNA  Anesthesia Plan Comments:         Anesthesia Quick Evaluation

## 2021-03-10 DIAGNOSIS — D485 Neoplasm of uncertain behavior of skin: Secondary | ICD-10-CM | POA: Diagnosis not present

## 2021-03-10 DIAGNOSIS — B078 Other viral warts: Secondary | ICD-10-CM | POA: Diagnosis not present

## 2021-04-10 NOTE — Progress Notes (Signed)
NEUROLOGY CONSULTATION NOTE  Lacey Simon MRN: [id number] DOB: May 10, 1975  Referring provider: Rennis Petty, FNP Primary care provider: No PCP  Reason for consult:  migraines, pseudotumor cerebri, RLS  Assessment/Plan:   Idiopathic intracranial hypertension.  Should have formal eye exam but since current symptoms are consistent, will restart furosemide Restless leg syndrome/neuropathy  Start furosemide 20mg  daily.  Check BMP today and again in 2 weeks Refilled Mirapex 0.5mg  daily and gabapentin 300mg  in AM and 600mg  at night for RLS.  Check B12 and ferritin level Diclofenac, promethazine and Benadryl for acute headache therapy Refer to ophthalmology Limit use of pain relievers to no more than 2 days out of week to prevent risk of rebound or medication-overuse headache. Follow up 4 months.    Subjective:  Lacey Simon is a 46 year old female who presents for above.  History supplemented by prior neurologist's and referring provider's notes.  Headaches since 2001 during pregnancy.  Diagnosed as migraines.  Started having tinnitus, pulsatile tinnitus and visual disturbance.  Diagnosed with idiopathic intracranial hypertension around 2014.  Followed by neurology and neuro-ophthalmology at the [facility] [place].  Initially treated with acetazolamide and topiramate which caused neuropathy and restless leg syndrome.  High doses of acetazolamide and topiramate caused calcium deficiency as well.  She was then switched to furosemide.  At one point had visual field defect where her neuro-ophthalmologist considered referral for VP shunt.  She underwent 3 LPs, the last in November 2018 revealed opening pressure of 35 cm H2O.  IIH was stable by 2019.  For restless leg and neuropathy symptoms, she took gabapentin 300mg  in AM and 600mg  at night as well as Mirapex 0.5mg  at bedtime.  She moved int  and has been off of her medications since 2021.  In early 2022, headaches returned.  She describes a  bilateral retro-orbital pressure pain as well as pain in back of neck radiating up to front of head.  Worse when laying supine but improved upright. Recently she was squatting to get something and when she stood up, vision went black for a couple of seconds.  Currently denies tinnitus or pulsatile tinnitus.    MRI of brain without contrast on 01/01/2017 showed partially empty sella and slightly increased CSF surrounding the optic nerves, suggesting ongoing chronic intracranial hypertension.  MRI of brain with and without contrast on 11/01/2017 was unchanged compared to prior imaging on 01/01/2017.  She also was evaluated by neurology for episodic left sided spasms and jerking.  Routine EEG on 11/01/2017 was normal.    Headache rescue protocol:  diclofenac, Benadryl, promethazine Current NSAIDS/analgesics:  none Current triptans:  none Current ergotamine:  none Current anti-emetic:  none Current muscle relaxants: none Current Antihypertensive medications:  none Current Antidepressant medications:  none Current Anticonvulsant medications:  none Current anti-CGRP:  none Current Vitamins/Herbal/Supplements:  none Current Antihistamines/Decongestants:  diphenhydramine Other therapy:  none Hormone/birth control:  none  Past NSAIDS/analgesics:  diclofenac, naproxen, ibuprofen, tramadol, hydrocodone Past abortive triptans:  none Past abortive ergotamine:  none Past muscle relaxants:  tizanidine Past anti-emetic:  Zofran, Compazine Past antihypertensive medications:  furosemide Past antidepressant medications:  none Past anticonvulsant medications:  topiramate (paresthesias), gabapentin 300mg /600mg  Past anti-CGRP:  none Past vitamins/Herbal/Supplements:  none Past antihistamines/decongestants:  Benadryl Other past therapies:  none   PAST MEDICAL HISTORY: Past Medical History:  Diagnosis Date   Pseudotumor cerebri     PAST SURGICAL HISTORY: Past Surgical History:  Procedure Laterality Date    CESAREAN SECTION  CHOLECYSTECTOMY     TUBAL LIGATION      MEDICATIONS: Current Outpatient Medications on File Prior to Visit  Medication Sig Dispense Refill   diclofenac (VOLTAREN) 75 MG EC tablet Take by mouth.     diphenhydrAMINE (BENADRYL) 25 mg capsule Take by mouth.     furosemide (LASIX) 20 MG tablet TAKE 1 TABLET BY MOUTH ONCE DAILY FOR 14 DAYS     gabapentin (NEURONTIN) 300 MG capsule Take 1 capsule by mouth in the morning and 2 capsules nightly     HYDROcodone-acetaminophen (NORCO/VICODIN) 5-325 MG tablet Take 1 tablet by mouth every 4 (four) hours as needed. 10 tablet 0   medroxyPROGESTERone (PROVERA) 10 MG tablet Take 2 tablets (20 mg total) by mouth 3 (three) times daily for 7 days. 42 tablet 0   naproxen (NAPROSYN) 500 MG tablet Take 1 tablet (500 mg total) by mouth 2 (two) times daily with a meal. 21 tablet 0   omeprazole (PRILOSEC) 40 MG capsule Take 1 capsule by mouth daily.     pramipexole (MIRAPEX) 0.5 MG tablet Take 1 tablet by mouth every evening.     promethazine (PHENERGAN) 25 MG tablet Take 1 tablet by mouth 2 (two) times daily as needed.     sucralfate (CARAFATE) 1 g tablet Take 1 tablet by mouth 2 (two) times a day.     No current facility-administered medications on file prior to visit.    ALLERGIES: Allergies  Allergen Reactions   Penicillins Shortness Of Breath   Latex    Sulfa Antibiotics    Erythromycin Palpitations    FAMILY HISTORY: No family history on file.  Objective:  Blood pressure 110/60, pulse 61, height 5\' 3"  (1.6 m), weight 141 lb 3.2 oz (64 kg), SpO2 97 %. General: No acute distress.  Patient appears well-groomed.   Head:  Normocephalic/atraumatic Eyes:  fundi examined but not visualized Neck: supple, no paraspinal tenderness, full range of motion Back: No paraspinal tenderness Heart: regular rate and rhythm Lungs: Clear to auscultation bilaterally. Vascular: No carotid bruits. Neurological Exam: Mental status: alert and  oriented to person, place, and time, recent and remote memory intact, fund of knowledge intact, attention and concentration intact, speech fluent and not dysarthric, language intact. Cranial nerves: CN I: not tested CN II: pupils equal, round and reactive to light, visual fields intact CN III, IV, VI:  full range of motion, no nystagmus, no ptosis CN V: facial sensation intact. CN VII: upper and lower face symmetric CN VIII: hearing intact CN IX, X: gag intact, uvula midline CN XI: sternocleidomastoid and trapezius muscles intact CN XII: tongue midline Bulk & Tone: normal, no fasciculations. Motor:  muscle strength 5/5 throughout Sensation:  Pinprick sensation reduced in feet; vibratory sensation intact. Deep Tendon Reflexes:  2+ throughout,  toes downgoing.   Finger to nose testing:  Without dysmetria.   Heel to shin:  Without dysmetria.   Gait:  Normal station and stride.  Romberg negative.    Thank you for allowing me to take part in the care of this patient.  Metta Clines, DO  CC: Rennis Petty, FNP

## 2021-04-11 ENCOUNTER — Other Ambulatory Visit (INDEPENDENT_AMBULATORY_CARE_PROVIDER_SITE_OTHER): Payer: 59

## 2021-04-11 ENCOUNTER — Ambulatory Visit (INDEPENDENT_AMBULATORY_CARE_PROVIDER_SITE_OTHER): Payer: 59 | Admitting: Neurology

## 2021-04-11 ENCOUNTER — Other Ambulatory Visit: Payer: Self-pay

## 2021-04-11 ENCOUNTER — Encounter: Payer: Self-pay | Admitting: Neurology

## 2021-04-11 VITALS — BP 110/60 | HR 61 | Ht 63.0 in | Wt 141.2 lb

## 2021-04-11 DIAGNOSIS — G932 Benign intracranial hypertension: Secondary | ICD-10-CM | POA: Diagnosis not present

## 2021-04-11 DIAGNOSIS — G2581 Restless legs syndrome: Secondary | ICD-10-CM | POA: Diagnosis not present

## 2021-04-11 LAB — VITAMIN B12: Vitamin B-12: 210 pg/mL — ABNORMAL LOW (ref 211–911)

## 2021-04-11 LAB — FERRITIN: Ferritin: 6.6 ng/mL — ABNORMAL LOW (ref 10.0–291.0)

## 2021-04-11 MED ORDER — PROMETHAZINE HCL 25 MG PO TABS: 25.0000 mg | ORAL_TABLET | Freq: Two times a day (BID) | ORAL | 5 refills | Status: DC | PRN

## 2021-04-11 MED ORDER — PRAMIPEXOLE DIHYDROCHLORIDE 0.5 MG PO TABS: 0.5000 mg | ORAL_TABLET | Freq: Every evening | ORAL | 5 refills | Status: DC

## 2021-04-11 MED ORDER — GABAPENTIN 300 MG PO CAPS: ORAL_CAPSULE | ORAL | 5 refills | Status: DC

## 2021-04-11 MED ORDER — DICLOFENAC SODIUM 75 MG PO TBEC: 75.0000 mg | DELAYED_RELEASE_TABLET | Freq: Two times a day (BID) | ORAL | 5 refills | Status: DC | PRN

## 2021-04-11 MED ORDER — FUROSEMIDE 20 MG PO TABS: 20.0000 mg | ORAL_TABLET | Freq: Every day | ORAL | 5 refills | Status: DC

## 2021-04-11 NOTE — Patient Instructions (Addendum)
Start furosemide 20mg  daily.  Check BMP today and again in 2 weeks.  Also check ferritin level and B12 levels today. Refer to ophthalmology Refilled Mirapex and gabapentin Diclofenac as needed for headache but Limit use of pain relievers to no more than 2 days out of week to prevent risk of rebound or medication-overuse headache. Follow up 4 months.

## 2021-04-12 LAB — BASIC METABOLIC PANEL
BUN: 5 mg/dL — ABNORMAL LOW (ref 6–23)
CO2: 26 mEq/L (ref 19–32)
Calcium: 8.4 mg/dL (ref 8.4–10.5)
Chloride: 103 mEq/L (ref 96–112)
Creatinine, Ser: 0.68 mg/dL (ref 0.40–1.20)
GFR: 105.31 mL/min (ref >60.00)
Glucose, Bld: 98 mg/dL (ref 70–99)
Potassium: 3.5 mEq/L (ref 3.5–5.1)
Sodium: 137 mEq/L (ref 135–145)

## 2021-04-13 ENCOUNTER — Encounter: Payer: Self-pay | Admitting: Neurology

## 2021-04-14 ENCOUNTER — Telehealth: Payer: Self-pay

## 2021-04-14 DIAGNOSIS — G2581 Restless legs syndrome: Secondary | ICD-10-CM

## 2021-04-14 NOTE — Telephone Encounter (Signed)
-----   Message from Pieter Partridge, DO sent at 04/13/2021 12:08 PM EST ----- Both B12 and ferritin (iron) levels are low 1. Start OTC B12 103mcg daily 2  Start ferrous sulfate 325mg  daily.  May cause constipation 3.  Recheck B12 and ferritin level about a week prior to follow up in June

## 2021-04-14 NOTE — Telephone Encounter (Signed)
Patient advised of her lab results. Labs in 6 months ordered

## 2021-04-25 ENCOUNTER — Encounter: Payer: Self-pay | Admitting: Neurology

## 2021-04-25 ENCOUNTER — Other Ambulatory Visit: Payer: 59

## 2021-05-30 DIAGNOSIS — Z0100 Encounter for examination of eyes and vision without abnormal findings: Secondary | ICD-10-CM | POA: Diagnosis not present

## 2021-05-30 DIAGNOSIS — H524 Presbyopia: Secondary | ICD-10-CM | POA: Diagnosis not present

## 2021-08-14 NOTE — Progress Notes (Deleted)
NEUROLOGY FOLLOW UP OFFICE NOTE  Lacey Simon [id number]  Assessment/Plan:   Idiopathic intracranial hypertension.   Restless leg syndrome/neuropathy   Start furosemide 20mg  daily.  Check BMP today and again in 2 weeks Refilled Mirapex 0.5mg  daily and gabapentin 300mg  in AM and 600mg  at night for RLS.  Check B12 and ferritin level Diclofenac, promethazine and Benadryl for acute headache therapy Refer to ophthalmology Limit use of pain relievers to no more than 2 days out of week to prevent risk of rebound or medication-overuse headache. Follow up 4 months.       Subjective:  Lacey Simon is a 46 year old female who follows up for IIH and RLS.  UPDATE: Labs from February revealed low B12 of 210 and low ferritin of 6.6.  She was advised to start OTC B12 [name] daily and ferrous sulfate 325mg  daily.  ***  She was referred to [facility] ***  Current medications: Acute headache:  diclofenac, promethazine, Benadryl IIH:  furosemide 20mg  daily RLS/neuropathy:  gabapentin 300mg  AM and 600mg  PM; Mirapex 0.5mg  at bedtime, B12 March daily, ferrous sulfate 325mg  daily    HISTORY: Headaches since 2001 during pregnancy.  Diagnosed as migraines.  Started having tinnitus, pulsatile tinnitus and visual disturbance.  Diagnosed with idiopathic intracranial hypertension around 2014.  Followed by neurology and neuro-ophthalmology at the [place] [facility] .  Initially treated with acetazolamide and topiramate which caused neuropathy and restless leg syndrome.  High doses of acetazolamide and topiramate caused calcium deficiency as well.  She was then switched to furosemide.  At one point had visual field defect where her neuro-ophthalmologist considered referral for VP shunt.  She underwent 3 LPs, the last in November 2018 revealed opening pressure of 35 cm H2O.  IIH was stable by 2019.  For restless leg and neuropathy symptoms, she took gabapentin 300mg  in AM and 600mg  at night as well  as Mirapex 0.5mg  at bedtime.  She moved int [place] and has been off of her medications since 2021.  In early 2022, headaches returned.  She describes a bilateral retro-orbital pressure pain as well as pain in back of neck radiating up to front of head.  Worse when laying supine but improved upright. Recently she was squatting to get something and when she stood up, vision went black for a couple of seconds.  Currently denies tinnitus or pulsatile tinnitus.     MRI of brain without contrast on 01/01/2017 showed partially empty sella and slightly increased CSF surrounding the optic nerves, suggesting ongoing chronic intracranial hypertension.  MRI of brain with and without contrast on 11/01/2017 was unchanged compared to prior imaging on 01/01/2017.   She also was evaluated by neurology for episodic left sided spasms and jerking.  Routine EEG on 11/01/2017 was normal.      Past NSAIDS/analgesics:  diclofenac, naproxen, ibuprofen, tramadol, hydrocodone Past abortive triptans:  none Past abortive ergotamine:  none Past muscle relaxants:  tizanidine Past anti-emetic:  Zofran, Compazine Past antihypertensive medications:  furosemide Past antidepressant medications:  none Past anticonvulsant medications:  topiramate (paresthesias), gabapentin 300mg /600mg  Past anti-CGRP:  none Past vitamins/Herbal/Supplements:  none Past antihistamines/decongestants:  Benadryl Other past therapies:  none  PAST MEDICAL HISTORY: Past Medical History:  Diagnosis Date   Anxiety    COPD (chronic obstructive pulmonary disease) (HCC)    ETOH abuse    Fatigue    Hx of substance abuse (HCC)    IBS (irritable bowel syndrome)    Idiopathic intracranial hypertension    Lung nodule  Optic disc edema    Pseudotumor cerebri    Restless leg syndrome     MEDICATIONS: Current Outpatient Medications on File Prior to Visit  Medication Sig Dispense Refill   diclofenac (VOLTAREN) 75 MG EC tablet Take 1 tablet (75 mg total) by mouth  2 (two) times daily as needed. 15 tablet 5   diphenhydrAMINE (BENADRYL) 25 mg capsule Take by mouth. (Patient not taking: Reported on 04/11/2021)     furosemide (LASIX) 20 MG tablet Take 1 tablet (20 mg total) by mouth daily. 30 tablet 5   gabapentin (NEURONTIN) 300 MG capsule Take 1 capsule by mouth in the morning and 2 capsules nightly 90 capsule 5   HYDROcodone-acetaminophen (NORCO/VICODIN) 5-325 MG tablet Take 1 tablet by mouth every 4 (four) hours as needed. (Patient not taking: Reported on 04/11/2021) 10 tablet 0   medroxyPROGESTERone (PROVERA) 10 MG tablet Take 2 tablets (20 mg total) by mouth 3 (three) times daily for 7 days. 42 tablet 0   naproxen (NAPROSYN) 500 MG tablet Take 1 tablet (500 mg total) by mouth 2 (two) times daily with a meal. (Patient not taking: Reported on 04/11/2021) 21 tablet 0   omeprazole (PRILOSEC) 40 MG capsule Take 1 capsule by mouth daily. (Patient not taking: Reported on 04/11/2021)     pramipexole (MIRAPEX) 0.5 MG tablet Take 1 tablet (0.5 mg total) by mouth every evening. 30 tablet 5   promethazine (PHENERGAN) 25 MG tablet Take 1 tablet (25 mg total) by mouth 2 (two) times daily as needed. 30 tablet 5   sucralfate (CARAFATE) 1 g tablet Take 1 tablet by mouth 2 (two) times a day. (Patient not taking: Reported on 04/11/2021)     No current facility-administered medications on file prior to visit.    ALLERGIES: Allergies  Allergen Reactions   Penicillins Shortness Of Breath   Latex    Sulfa Antibiotics    Erythromycin Palpitations    FAMILY HISTORY: Family History  Problem Relation Age of Onset   Stroke Maternal Aunt    Dementia Maternal Uncle       Objective:  *** General: No acute distress.  Patient appears ***-groomed.   Head:  Normocephalic/atraumatic Eyes:  Fundi examined but not visualized Neck: supple, no paraspinal tenderness, full range of motion Heart:  Regular rate and rhythm Lungs:  Clear to auscultation bilaterally Back: No paraspinal  tenderness Neurological Exam: alert and oriented to person, place, and time.  Speech fluent and not dysarthric, language intact.  CN II-XII intact. Bulk and tone normal, muscle strength 5/5 throughout.  Sensation to light touch intact.  Deep tendon reflexes 2+ throughout, toes downgoing.  Finger to nose testing intact.  Gait normal, Romberg negative.   Shon Millet, DO  CC: ***

## 2021-08-15 ENCOUNTER — Ambulatory Visit: Payer: Self-pay | Admitting: Neurology

## 2021-09-17 ENCOUNTER — Other Ambulatory Visit: Payer: Self-pay | Admitting: Neurology

## 2021-09-18 ENCOUNTER — Other Ambulatory Visit: Payer: Self-pay | Admitting: Neurology

## 2021-10-08 NOTE — Progress Notes (Signed)
NEUROLOGY FOLLOW UP OFFICE NOTE  Lacey Simon [id number]  Assessment/Plan:   Chronic migraine without aura, without status migrainosus, not intractable - she has at least 15 headache days a month for 3 consecutive months and has failed topiramate and amitriptyline Idiopathic intracranial hypertension - stable.   Restless leg syndrome Peripheral neuropathy   Plan to start Botox for chronic migraine (meets criteria).  Currently without insurance but will start soon.  Once she has insurance, she will contact us with her insurance information.   Mirapex 1mg  at bedtime and gabapentin 600mg  at night for RLS.  Check ferritin level. Furosemide 20mg  daily.  Check K on BMP Diclofenac, promethazine and Benadryl for acute headache therapy Limit use of pain relievers to no more than 2 days out of week to prevent risk of rebound or medication-overuse headache. Follow up 4 months.       Subjective:  Lacey Simon is a 46 year old female who follows up for IIH, RLS and neuropathy.  UPDATE: Restarted furosemide, gabapentin and Mirapex. Labs from February revealed ferritin level 6.6 and B12 210.  She was advised to start ferrous sulfate 325mg  and B12 [name] daily.  She still takes B12 but no longer on ferrous sulfate as she ran out.  She has since doubled the Mirapex to 1mg  at bedtime as 0.5mg  was ineffective.    She does not take furosemide daily because she forgets.  Will take it when her legs start to swell.  Headaches daily.  Referred to [facility] but never received a call.  Recently saw the optometrist at [facility].about 4 months ago.  Prescribed new contact lenses.  No evidence of papilledema or visual field defects.      Headache rescue protocol:  diclofenac, Benadryl, promethazine Current NSAIDS/analgesics:  diclofenac 50mg  Current triptans:  none Current ergotamine:  none Current anti-emetic:  promethazine Current muscle relaxants: none Current Antihypertensive medications:   furosemide 20mg  daily Current Antidepressant medications:  none Current Anticonvulsant medications:  gabapentin 600mg  QHS (morning dose makes her sleepy) Current anti-CGRP:  none Current Vitamins/Herbal/Supplements:  none Current Antihistamines/Decongestants:  diphenhydramine Other therapy:  none Other medications:  Mirapex 0.5mg  daily   HISTORY: Headaches since 2001 during pregnancy.  Diagnosed as migraines.  Started having tinnitus, pulsatile tinnitus and visual disturbance.  Diagnosed with idiopathic intracranial hypertension around 2014.  Followed by neurology and neuro-ophthalmology at the [place] [facility].  Initially treated with acetazolamide and topiramate which caused neuropathy and restless leg syndrome.  High doses of acetazolamide and topiramate caused calcium deficiency as well.  She was then switched to furosemide.  At one point had visual field defect where her neuro-ophthalmologist considered referral for VP shunt.  She underwent 3 LPs, the last in November 2018 revealed opening pressure of 35 cm H2O.  IIH was stable by 2019.  For restless leg and neuropathy symptoms, she took gabapentin 300mg  in AM and 600mg  at night as well as Mirapex 0.5mg  at bedtime.  She moved int [place] and has been off of her medications since 2021.  In early 2022, headaches returned.  She describes a bilateral retro-orbital pressure pain as well as pain in back of neck radiating up to front of head.  Worse when laying supine but improved upright. Recently she was squatting to get something and when she stood up, vision went black for a couple of seconds.  Currently denies tinnitus or pulsatile tinnitus.     MRI of brain without contrast on 01/01/2017 showed partially empty sella and slightly  increased CSF surrounding the optic nerves, suggesting ongoing chronic intracranial hypertension.  MRI of brain with and without contrast on 11/01/2017 was unchanged compared to prior imaging on 01/01/2017.   She also was  evaluated by neurology for episodic left sided spasms and jerking.  Routine EEG on 11/01/2017 was normal.        Past NSAIDS/analgesics:  diclofenac, naproxen, ibuprofen, tramadol, hydrocodone Past abortive triptans:  sumatriptan tab Past abortive ergotamine:  none Past muscle relaxants:  tizanidine Past anti-emetic:  Zofran, Compazine Past antihypertensive medications:  furosemide Past antidepressant medications:  amitriptyline Past anticonvulsant medications:  topiramate (paresthesias).  Would not use zonisamide as she has anaphylactic reaction to sulfa drugs. Past anti-CGRP:  none Past vitamins/Herbal/Supplements:  none Past antihistamines/decongestants:  Benadryl Other past therapies:  Diamox  PAST MEDICAL HISTORY: Past Medical History:  Diagnosis Date   Anxiety    COPD (chronic obstructive pulmonary disease) (HCC)    ETOH abuse    Fatigue    Hx of substance abuse (HCC)    IBS (irritable bowel syndrome)    Idiopathic intracranial hypertension    Lung nodule    Optic disc edema    Pseudotumor cerebri    Restless leg syndrome     MEDICATIONS: Current Outpatient Medications on File Prior to Visit  Medication Sig Dispense Refill   diclofenac (VOLTAREN) 75 MG EC tablet Take 1 tablet (75 mg total) by mouth 2 (two) times daily as needed. 15 tablet 5   diphenhydrAMINE (BENADRYL) 25 mg capsule Take by mouth. (Patient not taking: Reported on 04/11/2021)     furosemide (LASIX) 20 MG tablet Take 1 tablet (20 mg total) by mouth daily. 30 tablet 5   gabapentin (NEURONTIN) 300 MG capsule Take 1 capsule by mouth in the morning and 2 capsules nightly 90 capsule 5   HYDROcodone-acetaminophen (NORCO/VICODIN) 5-325 MG tablet Take 1 tablet by mouth every 4 (four) hours as needed. (Patient not taking: Reported on 04/11/2021) 10 tablet 0   medroxyPROGESTERone (PROVERA) 10 MG tablet Take 2 tablets (20 mg total) by mouth 3 (three) times daily for 7 days. 42 tablet 0   naproxen (NAPROSYN) 500 MG  tablet Take 1 tablet (500 mg total) by mouth 2 (two) times daily with a meal. (Patient not taking: Reported on 04/11/2021) 21 tablet 0   omeprazole (PRILOSEC) 40 MG capsule Take 1 capsule by mouth daily. (Patient not taking: Reported on 04/11/2021)     pramipexole (MIRAPEX) 0.5 MG tablet TAKE 1 TABLET BY MOUTH EVERY EVENING. 30 tablet 0   promethazine (PHENERGAN) 25 MG tablet Take 1 tablet (25 mg total) by mouth 2 (two) times daily as needed. 30 tablet 5   sucralfate (CARAFATE) 1 g tablet Take 1 tablet by mouth 2 (two) times a day. (Patient not taking: Reported on 04/11/2021)     No current facility-administered medications on file prior to visit.    ALLERGIES: Allergies  Allergen Reactions   Penicillins Shortness Of Breath   Latex    Sulfa Antibiotics    Erythromycin Palpitations    FAMILY HISTORY: Family History  Problem Relation Age of Onset   Stroke Maternal Aunt    Dementia Maternal Uncle       Objective:  Blood pressure 136/66, pulse 82, resp. rate 18, height 5\' 3"  (1.6 m), weight 143 lb (64.9 kg), SpO2 98 %. General: No acute distress.  Patient appears well-groomed.       , DO

## 2021-10-09 ENCOUNTER — Encounter: Payer: Self-pay | Admitting: Neurology

## 2021-10-09 ENCOUNTER — Ambulatory Visit (INDEPENDENT_AMBULATORY_CARE_PROVIDER_SITE_OTHER): Payer: Self-pay | Admitting: Neurology

## 2021-10-09 ENCOUNTER — Other Ambulatory Visit (INDEPENDENT_AMBULATORY_CARE_PROVIDER_SITE_OTHER): Payer: Self-pay

## 2021-10-09 VITALS — BP 136/66 | HR 82 | Resp 18 | Ht 63.0 in | Wt 143.0 lb

## 2021-10-09 DIAGNOSIS — G43709 Chronic migraine without aura, not intractable, without status migrainosus: Secondary | ICD-10-CM

## 2021-10-09 DIAGNOSIS — G932 Benign intracranial hypertension: Secondary | ICD-10-CM

## 2021-10-09 DIAGNOSIS — G629 Polyneuropathy, unspecified: Secondary | ICD-10-CM

## 2021-10-09 DIAGNOSIS — G2581 Restless legs syndrome: Secondary | ICD-10-CM

## 2021-10-09 MED ORDER — GABAPENTIN 300 MG PO CAPS: 600.0000 mg | ORAL_CAPSULE | Freq: Every day | ORAL | 5 refills | Status: DC

## 2021-10-09 MED ORDER — PRAMIPEXOLE DIHYDROCHLORIDE 0.5 MG PO TABS: 1.0000 mg | ORAL_TABLET | Freq: Every evening | ORAL | 5 refills | Status: DC

## 2021-10-09 MED ORDER — FUROSEMIDE 20 MG PO TABS: 20.0000 mg | ORAL_TABLET | Freq: Every day | ORAL | 5 refills | Status: DC

## 2021-10-09 NOTE — Patient Instructions (Addendum)
Refilled Mirapex, gabapentin, and furosemide Check basic metabolic panel and ferritin level Once you get your insurance, contact us with the information and we will send for prior authorization to start Botox Follow up in 4 months. Your provider has requested that you have labwork completed today. Please go to [facility] ([id number]) on the second floor of this building before leaving the office today. You do not need to check in. If you are not called within 15 minutes please check with the front desk.

## 2021-10-10 LAB — BASIC METABOLIC PANEL
BUN: 7 mg/dL (ref 6–23)
CO2: 25 mEq/L (ref 19–32)
Calcium: 8.4 mg/dL (ref 8.4–10.5)
Chloride: 102 mEq/L (ref 96–112)
Creatinine, Ser: 0.83 mg/dL (ref 0.40–1.20)
GFR: 84.95 mL/min (ref >60.00)
Glucose, Bld: 103 mg/dL — ABNORMAL HIGH (ref 70–99)
Potassium: 3.8 mEq/L (ref 3.5–5.1)
Sodium: 136 mEq/L (ref 135–145)

## 2021-10-10 LAB — FERRITIN: Ferritin: 7.9 ng/mL — ABNORMAL LOW (ref 10.0–291.0)

## 2021-10-12 ENCOUNTER — Telehealth: Payer: Self-pay | Admitting: Neurology

## 2021-10-12 NOTE — Telephone Encounter (Signed)
Pt called in to get her results. She says she may be at work tomorrow and to leave the details on her voicemail.

## 2021-10-12 NOTE — Telephone Encounter (Signed)
Patient returned call about her lab results. Okay to leave details on her voice mail per patient.

## 2021-10-12 NOTE — Telephone Encounter (Signed)
Called and left message to call office. She is in Results note and notes are there.

## 2021-10-13 NOTE — Telephone Encounter (Signed)
Called and gave results per Dr. Everlena Cooper. She understands

## 2021-11-30 ENCOUNTER — Encounter (HOSPITAL_BASED_OUTPATIENT_CLINIC_OR_DEPARTMENT_OTHER): Payer: Self-pay

## 2021-11-30 ENCOUNTER — Emergency Department (HOSPITAL_BASED_OUTPATIENT_CLINIC_OR_DEPARTMENT_OTHER): Payer: Medicaid Other | Admitting: Radiology

## 2021-11-30 ENCOUNTER — Other Ambulatory Visit: Payer: Self-pay

## 2021-11-30 ENCOUNTER — Emergency Department (HOSPITAL_BASED_OUTPATIENT_CLINIC_OR_DEPARTMENT_OTHER)
Admission: EM | Admit: 2021-11-30 | Discharge: 2021-11-30 | Disposition: A | Discharge status: Home / Self Care | Payer: Medicaid Other | Attending: Emergency Medicine | Admitting: Emergency Medicine

## 2021-11-30 DIAGNOSIS — J449 Chronic obstructive pulmonary disease, unspecified: Secondary | ICD-10-CM | POA: Insufficient documentation

## 2021-11-30 DIAGNOSIS — F17210 Nicotine dependence, cigarettes, uncomplicated: Secondary | ICD-10-CM | POA: Insufficient documentation

## 2021-11-30 DIAGNOSIS — M79642 Pain in left hand: Secondary | ICD-10-CM | POA: Insufficient documentation

## 2021-11-30 DIAGNOSIS — Z91040 Latex allergy status: Secondary | ICD-10-CM | POA: Insufficient documentation

## 2021-11-30 MED ORDER — ACETAMINOPHEN 500 MG PO TABS: 1000.0000 mg | ORAL_TABLET | Freq: Once | ORAL | Status: DC

## 2021-11-30 MED ORDER — ACETAMINOPHEN 325 MG PO TABS
650.0000 mg | ORAL_TABLET | Freq: Once | ORAL | Status: AC
  Administered 2021-11-30: 650 mg via ORAL

## 2021-11-30 NOTE — ED Triage Notes (Signed)
Pt states [place] Pt states she had bruising on the hand. Pain is worse between base of thumb and wrist.

## 2021-11-30 NOTE — ED Notes (Signed)
RN provided AVS using Teachback Method. Patient verbalizes understanding of Discharge Instructions. Opportunity for Questioning and Answers were provided by RN. Patient Discharged from ED ambulatory to Home via Self.  

## 2021-11-30 NOTE — Discharge Instructions (Addendum)
We evaluated you for your hand pain.  Your x-ray was negative.  You can sometimes have a fracture in your hand but does not show up on x-ray.  This is called a scaphoid fracture.  We have placed you in a splint.  Please wear the splint at all times until you follow-up with a hand surgeon Dr. Fredna Dow, they will reevaluate you and decide whether you need repeat x-rays.  Please take Tylenol and Motrin for your symptoms at home.  You can take 650 mg of Tylenol every 6 hours and 600 mg of ibuprofen every 6 hours as needed for your symptoms.  You can take these medicines together as needed, either at the same time, or alternating every 3 hours.  Please return to the emergency department if you develop any new or worsening symptoms, numbness or tingling, weakness, or any other concerning symptoms.

## 2021-11-30 NOTE — ED Provider Notes (Signed)
[place] EMERGENCY DEPT Provider Note  CSN: [id number] Arrival date & time: 11/30/21 1733  Chief Complaint(s) Hand Pain  HPI Lacey Simon is a 46 y.o. female with history of COPD presenting to the emergency department with left hand pain.  Patient reports that she bumped it on a railing last weekend.  Pain is worse with moving her thumb and hand.  No numbness or tingling.  No other injuries.  Symptoms moderate.   Past Medical History Past Medical History:  Diagnosis Date   Anxiety    COPD (chronic obstructive pulmonary disease) (HCC)    ETOH abuse    Fatigue    Hx of substance abuse (HCC)    IBS (irritable bowel syndrome)    Idiopathic intracranial hypertension    Lung nodule    Optic disc edema    Pseudotumor cerebri    Restless leg syndrome    There are no problems to display for this patient.  Home Medication(s) Prior to Admission medications   Medication Sig Start Date End Date Taking? Authorizing Provider  diclofenac (VOLTAREN) 75 MG EC tablet Take 1 tablet (75 mg total) by mouth 2 (two) times daily as needed. 04/11/21   Pieter Partridge, DO  diphenhydrAMINE (BENADRYL) 25 mg capsule Take by mouth. 09/18/19   [provider]  furosemide (LASIX) 20 MG tablet Take 1 tablet (20 mg total) by mouth daily. 10/09/21   Pieter Partridge, DO  gabapentin (NEURONTIN) 300 MG capsule Take 2 capsules (600 mg total) by mouth at bedtime. 10/09/21   Pieter Partridge, DO  HYDROcodone-acetaminophen (NORCO/VICODIN) 5-325 MG tablet Take 1 tablet by mouth every 4 (four) hours as needed. 09/09/18   Evalee Jefferson, PA-C  medroxyPROGESTERone (PROVERA) 10 MG tablet Take 2 tablets (20 mg total) by mouth 3 (three) times daily for 7 days. 09/12/20 09/19/20  Isla Pence, MD  naproxen (NAPROSYN) 500 MG tablet Take 1 tablet (500 mg total) by mouth 2 (two) times daily with a meal. Patient not taking: Reported on 10/09/2021 11/02/18   Caccavale, Sophia, PA-C  omeprazole (PRILOSEC) 40 MG capsule  Take 1 capsule by mouth daily. 08/21/18   [provider]  pramipexole (MIRAPEX) 0.5 MG tablet Take 2 tablets (1 mg total) by mouth every evening. 10/09/21   Pieter Partridge, DO  promethazine (PHENERGAN) 25 MG tablet Take 1 tablet (25 mg total) by mouth 2 (two) times daily as needed. 04/11/21   Pieter Partridge, DO  sucralfate (CARAFATE) 1 g tablet Take 1 tablet by mouth 2 (two) times a day. 04/09/18   [provider]                                                                                                                                    Past Surgical History Past Surgical History:  Procedure Laterality Date   CESAREAN SECTION     CHOLECYSTECTOMY     DENTAL SURGERY  TUBAL LIGATION     Family History Family History  Problem Relation Age of Onset   Stroke Maternal Aunt    Dementia Maternal Uncle     Social History Social History   Tobacco Use   Smoking status: Every Day    Packs/day: 0.50    Types: Cigarettes   Smokeless tobacco: Never  Vaping Use   Vaping Use: Every day  Substance Use Topics   Alcohol use: Yes   Drug use: Never   Allergies Penicillins, Latex, Sulfa antibiotics, and Erythromycin  Review of Systems Review of Systems  All other systems reviewed and are negative.   Physical Exam Vital Signs  I have reviewed the triage vital signs BP (!) 130/56 (BP Location: Right Arm)   Pulse (!) 58   Temp 98.3 F (36.8 C) (Oral)   Resp 16   Ht 5\' 3"  (1.6 m)   Wt 61.7 kg   LMP 11/08/2021   SpO2 100%   BMI 24.09 kg/m  Physical Exam Vitals and nursing note reviewed.  Constitutional:      Appearance: Normal appearance.  HENT:     Head: Normocephalic and atraumatic.     Mouth/Throat:     Mouth: Mucous membranes are moist.  Eyes:     Conjunctiva/sclera: Conjunctivae normal.  Cardiovascular:     Rate and Rhythm: Normal rate.  Pulmonary:     Effort: Pulmonary effort is normal. No respiratory distress.  Abdominal:     General: Abdomen is  flat.  Musculoskeletal:        General: No deformity.     Comments: Left hand snuffbox tenderness present.  No obvious deformity.  Mild swelling over the dorsum of the left hand.  Able to range at the wrist with some discomfort.  Skin:    General: Skin is warm and dry.     Capillary Refill: Capillary refill takes less than 2 seconds.  Neurological:     General: No focal deficit present.     Mental Status: She is alert. Mental status is at baseline.  Psychiatric:        Mood and Affect: Mood normal.        Behavior: Behavior normal.     ED Results and Treatments Labs (all labs ordered are listed, but only abnormal results are displayed) Labs Reviewed - No data to display                                                                                                                        Radiology DG Hand Complete Left  Result Date: 11/30/2021 CLINICAL DATA:  Hit hand on railing. EXAM: LEFT HAND - COMPLETE 3+ VIEW COMPARISON:  None Available. FINDINGS: There is 2 mm ulnar negative variance. Normal bone mineralization. Joint spaces are preserved. No acute fracture is seen. No dislocation. IMPRESSION: No acute fracture. Electronically Signed   By: 01/30/2022 M.D.   On: 11/30/2021 18:24    Pertinent labs & imaging results that were available  during my care of the patient were reviewed by me and considered in my medical decision making (see MDM for details).  Medications Ordered in ED Medications  acetaminophen (TYLENOL) tablet 1,000 mg (has no administration in time range)                                                                                                                                     Procedures Procedures  (including critical care time)  Medical Decision Making / ED Course   MDM:  46 year old female presenting with injury to the hand.  X-ray without evidence of acute fracture.  No neurologic deficit.  Capillary refill reassuring.  Does have scaphoid  tenderness so will place in thumb spica splint.  No wounds to the skin.  Will have patient follow-up with hand surgery for repeat x-rays      Additional history obtained:  -External records from outside source obtained and reviewed including: Chart review including previous notes, labs, imaging, consultation notes   Imaging Studies ordered: I ordered imaging studies including XR left hand On my interpretation imaging demonstrates no obvious fx I independently visualized and interpreted imaging. I agree with the radiologist interpretation   Medicines ordered and prescription drug management: Meds ordered this encounter  Medications   acetaminophen (TYLENOL) tablet 1,000 mg    -I have reviewed the patients home medicines and have made adjustments as needed   Social Determinants of Health:  Factors impacting patients care include: current smoker   Reevaluation: After the interventions noted above, I reevaluated the patient and found that they have improved  Co morbidities that complicate the patient evaluation  Past Medical History:  Diagnosis Date   Anxiety    COPD (chronic obstructive pulmonary disease) (HCC)    ETOH abuse    Fatigue    Hx of substance abuse (HCC)    IBS (irritable bowel syndrome)    Idiopathic intracranial hypertension    Lung nodule    Optic disc edema    Pseudotumor cerebri    Restless leg syndrome       Dispostion: Discharge    Final Clinical Impression(s) / ED Diagnoses Final diagnoses:  Left hand pain     This chart was dictated using voice recognition software.  Despite best efforts to proofread,  errors can occur which can change the documentation meaning.    Lonell Grandchild, MD 11/30/21 Paulo Fruit

## 2022-02-22 NOTE — Progress Notes (Signed)
NEUROLOGY FOLLOW UP OFFICE NOTE  Lacey Simon [id number]  Assessment/Plan:   Chronic migraine without aura, without status migrainosus, not intractable - she has at least 15 headache days a month for 3 consecutive months and has failed topiramate and amitriptyline Idiopathic intracranial hypertension - stable.  - unclear if it may be active - endorses occasional pulsatile tinnitus and brief visual obscurations. Restless leg syndrome Peripheral neuropathy   Migraine management Prevention:  Plan to start Botox (meets criteria) Rescue:  diclofenac, promethazine and Benadryl Limit use of pain relievers to no more than 2 days out of week to prevent risk of rebound or medication-overuse headache. Keep headache diary Idiopathic intracranial hypertension Will schedule LP to measure opening pressure (will need CT head first) Recommended getting a repeat eye exam Furosemide 20mg  daily (has been inconsistent). Check BMP Restless leg syndrome: Mirapex 1mg  at bedtime and gabapentin 600mg  at night Taking ferrous sulfate 325mg  daily.  Check ferritin level Follow up for first round of Botox       Subjective:  Lacey Simon is a 46 year old female who follows up for migraines, IIH, RLS and neuropathy.   UPDATE: Restless Leg Syndrome: Current medications:  Mirapex 1mg  at bedtime; gabapentin 600mg  at night, ferrous sulfate 325mg  daily Ferritin level in August was 7.9.  She was advised to continue ferrous sulfate 325mg  daily but to contact her PCP about possible iron infusions.  She does not have a PCP.    Migraines: Rescue protocol:  diclofenac, Benadryl, promethazine Plan was to start Botox for chronic migraine.  She was to contact once she got insurance.  She now has Lacey Simon.  Idiopathic Intracranial Hypertension: Current medication:  furosemide 20mg  daily.  Potassium in August was stable at 3.8.  She takes OTC potassium gluconate.  She reports intermittent dizziness  (positional spinning).  On a couple of occasions, she heard "whooshing sound" while laying down a couple of times.  Last eye exam in April was fine.  Also infrequently, she may note brief dimming of vision.  She is on furosemide but may skip doses because she forgets to take it.  She urinates frequently.    Current NSAIDS/analgesics:  diclofenac 50mg  Current triptans:  none Current ergotamine:  none Current anti-emetic:  promethazine Current muscle relaxants: none Current Antihypertensive medications:  furosemide 20mg  daily Current Antidepressant medications:  none Current Anticonvulsant medications:  gabapentin 600mg  QHS (morning dose makes her sleepy) Current anti-CGRP:  none Current Vitamins/Herbal/Supplements:  none Current Antihistamines/Decongestants:  diphenhydramine Other therapy:  none Other medications:  Mirapex 1mg  at bedtime  10/09/2021 BMP:  Na 136, K 3.8, Cl 102, CO2 25, glucose 103, BUN 7, Cr 0.83, Ca 8.4, GFR 84.95   HISTORY: Headaches since 2001 during pregnancy.  Diagnosed as migraines.  Started having tinnitus, pulsatile tinnitus and visual disturbance.  Diagnosed with idiopathic intracranial hypertension around 2014.  Followed by neurology and neuro-ophthalmology at the [facility].  Initially treated with acetazolamide and topiramate which caused neuropathy and restless leg syndrome.  High doses of acetazolamide and topiramate caused calcium deficiency as well.  She was then switched to furosemide.  At one point had visual field defect where her neuro-ophthalmologist considered referral for VP shunt.  She underwent 3 LPs, the last in November 2018 revealed opening pressure of 35 cm H2O.  IIH was stable by 2019.  For restless leg and neuropathy symptoms, she took gabapentin 300mg  in AM and 600mg  at night as well as Mirapex 0.5mg  at bedtime.  She moved int [place] and has been off of her medications since 2021.  In early 2022, headaches returned.  She describes a bilateral  retro-orbital pressure pain as well as pain on top of head radiating down and across forehead, pain in back of neck radiating up to front of head.  Worse when laying supine but improved upright.    MRI of brain without contrast on 01/01/2017 showed partially empty sella and slightly increased CSF surrounding the optic nerves, suggesting ongoing chronic intracranial hypertension.  MRI of brain with and without contrast on 11/01/2017 was unchanged compared to prior imaging on 01/01/2017.   She also was evaluated by neurology for episodic left sided spasms and jerking.  Routine EEG on 11/01/2017 was normal.         Past NSAIDS/analgesics:  diclofenac, naproxen, ibuprofen, tramadol, hydrocodone Past abortive triptans:  sumatriptan tab Past abortive ergotamine:  none Past muscle relaxants:  tizanidine Past anti-emetic:  Zofran, Compazine Past antihypertensive medications:  furosemide Past antidepressant medications:  amitriptyline Past anticonvulsant medications:  topiramate (paresthesias).  Would not use zonisamide as she has anaphylactic reaction to sulfa drugs. Past anti-CGRP:  none Past vitamins/Herbal/Supplements:  none Past antihistamines/decongestants:  Benadryl Other past therapies:  Diamox  PAST MEDICAL HISTORY: Past Medical History:  Diagnosis Date   Anxiety    COPD (chronic obstructive pulmonary disease) (HCC)    ETOH abuse    Fatigue    Hx of substance abuse (HCC)    IBS (irritable bowel syndrome)    Idiopathic intracranial hypertension    Lung nodule    Optic disc edema    Pseudotumor cerebri    Restless leg syndrome     MEDICATIONS: Current Outpatient Medications on File Prior to Visit  Medication Sig Dispense Refill   diclofenac (VOLTAREN) 75 MG EC tablet Take 1 tablet (75 mg total) by mouth 2 (two) times daily as needed. 15 tablet 5   diphenhydrAMINE (BENADRYL) 25 mg capsule Take by mouth.     furosemide (LASIX) 20 MG tablet Take 1 tablet (20 mg total) by mouth daily.  30 tablet 5   gabapentin (NEURONTIN) 300 MG capsule Take 2 capsules (600 mg total) by mouth at bedtime. 60 capsule 5   HYDROcodone-acetaminophen (NORCO/VICODIN) 5-325 MG tablet Take 1 tablet by mouth every 4 (four) hours as needed. 10 tablet 0   medroxyPROGESTERone (PROVERA) 10 MG tablet Take 2 tablets (20 mg total) by mouth 3 (three) times daily for 7 days. 42 tablet 0   naproxen (NAPROSYN) 500 MG tablet Take 1 tablet (500 mg total) by mouth 2 (two) times daily with a meal. (Patient not taking: Reported on 10/09/2021) 21 tablet 0   omeprazole (PRILOSEC) 40 MG capsule Take 1 capsule by mouth daily.     pramipexole (MIRAPEX) 0.5 MG tablet Take 2 tablets (1 mg total) by mouth every evening. 30 tablet 5   promethazine (PHENERGAN) 25 MG tablet Take 1 tablet (25 mg total) by mouth 2 (two) times daily as needed. 30 tablet 5   sucralfate (CARAFATE) 1 g tablet Take 1 tablet by mouth 2 (two) times a day.     No current facility-administered medications on file prior to visit.    ALLERGIES: Allergies  Allergen Reactions   Penicillins Shortness Of Breath   Latex    Sulfa Antibiotics    Erythromycin Palpitations    FAMILY HISTORY: Family History  Problem Relation Age of Onset   Stroke Maternal Aunt    Dementia Maternal Uncle  Objective:  Blood pressure 118/78, pulse 75, height 5\' 3"  (1.6 m), weight 148 lb (67.1 kg), SpO2 100 %. General: No acute distress.  Patient appears well-groomed.   Head:  Normocephalic/atraumatic Eyes:  Fundi examined but not visualized Neck: supple, no paraspinal tenderness, full range of motion Heart:  Regular rate and rhythm Lungs:  Clear to auscultation bilaterally Back: No paraspinal tenderness Neurological Exam: alert and oriented to person, place, and time.  Speech fluent and not dysarthric, language intact.  CN II-XII intact. Bulk and tone normal, muscle strength 5/5 throughout.  Sensation to light touch intact.  Deep tendon reflexes 2+ throughout, toes  downgoing.  Finger to nose testing intact.  Gait normal, Romberg negative.   , DO

## 2022-02-23 ENCOUNTER — Other Ambulatory Visit (INDEPENDENT_AMBULATORY_CARE_PROVIDER_SITE_OTHER): Payer: Medicaid Other

## 2022-02-23 ENCOUNTER — Telehealth: Payer: Self-pay

## 2022-02-23 ENCOUNTER — Encounter: Payer: Self-pay | Admitting: Neurology

## 2022-02-23 ENCOUNTER — Telehealth: Payer: Self-pay | Admitting: Pharmacy Technician

## 2022-02-23 ENCOUNTER — Other Ambulatory Visit (HOSPITAL_COMMUNITY): Payer: Self-pay

## 2022-02-23 ENCOUNTER — Ambulatory Visit (INDEPENDENT_AMBULATORY_CARE_PROVIDER_SITE_OTHER): Payer: Medicaid Other | Admitting: Neurology

## 2022-02-23 VITALS — BP 118/78 | HR 75 | Ht 63.0 in | Wt 148.0 lb

## 2022-02-23 DIAGNOSIS — G2581 Restless legs syndrome: Secondary | ICD-10-CM

## 2022-02-23 DIAGNOSIS — G43709 Chronic migraine without aura, not intractable, without status migrainosus: Secondary | ICD-10-CM

## 2022-02-23 DIAGNOSIS — G932 Benign intracranial hypertension: Secondary | ICD-10-CM | POA: Diagnosis not present

## 2022-02-23 DIAGNOSIS — Z79899 Other long term (current) drug therapy: Secondary | ICD-10-CM | POA: Diagnosis not present

## 2022-02-23 LAB — BASIC METABOLIC PANEL
BUN: 8 mg/dL (ref 6–23)
CO2: 24 mEq/L (ref 19–32)
Calcium: 8.5 mg/dL (ref 8.4–10.5)
Chloride: 106 mEq/L (ref 96–112)
Creatinine, Ser: 0.73 mg/dL (ref 0.40–1.20)
GFR: 98.84 mL/min (ref >60.00)
Glucose, Bld: 80 mg/dL (ref 70–99)
Potassium: 4.1 mEq/L (ref 3.5–5.1)
Sodium: 137 mEq/L (ref 135–145)

## 2022-02-23 LAB — FERRITIN: Ferritin: 9.3 ng/mL — ABNORMAL LOW (ref 10.0–291.0)

## 2022-02-23 MED ORDER — DICLOFENAC SODIUM 75 MG PO TBEC: 75.0000 mg | DELAYED_RELEASE_TABLET | Freq: Two times a day (BID) | ORAL | 5 refills | Status: AC | PRN

## 2022-02-23 MED ORDER — GABAPENTIN 300 MG PO CAPS: 600.0000 mg | ORAL_CAPSULE | Freq: Every day | ORAL | 5 refills | Status: DC

## 2022-02-23 MED ORDER — BOTOX 200 UNITS IJ SOLR
INTRAMUSCULAR | 4 refills | Status: DC
  Filled 2022-02-23 (×2): qty 1, 90d supply, fill #0
  Filled 2022-02-23: qty 1, fill #0
  Filled 2022-05-15: qty 1, 90d supply, fill #1

## 2022-02-23 MED ORDER — PRAMIPEXOLE DIHYDROCHLORIDE 0.5 MG PO TABS: 1.0000 mg | ORAL_TABLET | Freq: Every evening | ORAL | 5 refills | Status: DC

## 2022-02-23 NOTE — Telephone Encounter (Signed)
Prior Authorization for BOTOX 200U has been approved.    PA# [id number] Effective dates: 12.29.23 through 6.26.24  Can be filled at [facility]. Copay will be $4

## 2022-02-23 NOTE — Telephone Encounter (Signed)
Patient seen in office today. Per Dr.Jaffe patient to start Botox.  PA team please start a PA for Botox 200 units every 90 days.  

## 2022-02-23 NOTE — Telephone Encounter (Addendum)
BotoxOne verification has been submitted. Benefit Verification:  [id number]   Pharmacy PA has been submitted for BOTOX 200U via CoverMyMeds. INSURANCE: [employer]  SUBMITTED: 12.29.23 KEY: [id number]  Status is pending

## 2022-02-23 NOTE — Patient Instructions (Addendum)
1.Help finding a primary care provider within [place]. If you do not have access to a computer or the Internet you can call [phone] and  they will help you scheduled with a provider.  Or you can go to: [email or web address]   2.  Will start Botox 3.  Will set up for spinal tap (CSF cell count, protein, glucose, gram stain culture, cytology).  First will need CT head 4.  Check BMP and ferritin level 5.  Follow up for Botox.

## 2022-02-24 ENCOUNTER — Encounter: Payer: Self-pay | Admitting: Neurology

## 2022-02-28 ENCOUNTER — Other Ambulatory Visit: Payer: Self-pay

## 2022-02-28 NOTE — Progress Notes (Signed)
Patient advised of her Lab results.

## 2022-03-01 ENCOUNTER — Other Ambulatory Visit (HOSPITAL_COMMUNITY): Payer: Self-pay

## 2022-03-05 ENCOUNTER — Encounter: Payer: Self-pay | Admitting: Neurology

## 2022-03-09 ENCOUNTER — Ambulatory Visit: Payer: Medicaid Other | Admitting: Neurology

## 2022-03-09 DIAGNOSIS — G43709 Chronic migraine without aura, not intractable, without status migrainosus: Secondary | ICD-10-CM | POA: Diagnosis not present

## 2022-03-09 MED ORDER — ONABOTULINUMTOXINA 100 UNITS IJ SOLR
200.0000 [IU] | Freq: Once | INTRAMUSCULAR | Status: AC
  Administered 2022-03-09: 155 [IU] via INTRAMUSCULAR

## 2022-03-09 NOTE — Progress Notes (Signed)
[facility]  ° °Procedure Note Botox ° °Attending: Dr. Clova Morlock ° °Preoperative Diagnosis(es): Chronic migraine ° °Consent obtained from: The patient °Benefits discussed included, but were not limited to decreased muscle tightness, increased joint range of motion, and decreased pain.  Risk discussed included, but were not limited pain and discomfort, bleeding, bruising, excessive weakness, venous thrombosis, muscle atrophy and dysphagia.  Anticipated outcomes of the procedure as well as he risks and benefits of the alternatives to the procedure, and the roles and tasks of the personnel to be involved, were discussed with the patient, and the patient consents to the procedure and agrees to proceed. A copy of the patient medication guide was given to the patient which explains the blackbox warning. ° °Patients identity and treatment sites confirmed Yes.  . ° °Details of Procedure: °Skin was cleaned with alcohol. Prior to injection, the needle plunger was aspirated to make sure the needle was not within a blood vessel.  There was no blood retrieved on aspiration.   ° °Following is a summary of the muscles injected  And the amount of Botulinum toxin used: ° °Dilution °200 units of Botox was reconstituted with 4 ml of preservative free normal saline. °Time of reconstitution: At the time of the office visit (<30 minutes prior to injection)  ° °Injections  °155 total units of Botox was injected with a 30 gauge needle. ° °Injection Sites: °L occipitalis: 15 units- 3 sites  °R occiptalis: 15 units- 3 sites ° °L upper trapezius: 15 units- 3 sites °R upper trapezius: 15 units- 3 sits          °L paraspinal: 10 units- 2 sites °R paraspinal: 10 units- 2 sites ° °Face °L frontalis(2 injection sites):10 units   °R frontalis(2 injection sites):10 units         °L corrugator: 5 units   °R corrugator: 5 units           °Procerus: 5 units   °L temporalis: 20 units °R temporalis: 20 units  ° °Agent:  °200 units of botulinum Type  A (Onobotulinum Toxin type A) was reconstituted with 4 ml of preservative free normal saline.  °Time of reconstitution: At the time of the office visit (<30 minutes prior to injection)  ° ° ° Total injected (Units):  155 ° Total wasted (Units):  45 ° °Patient tolerated procedure well without complications.   °Reinjection is anticipated in 3 months. ° ° °

## 2022-03-22 ENCOUNTER — Ambulatory Visit
Admission: RE | Admit: 2022-03-22 | Discharge: 2022-03-22 | Disposition: A | Discharge status: Home / Self Care | Payer: Medicaid Other | Source: Ambulatory Visit | Attending: Neurology | Admitting: Neurology

## 2022-03-22 DIAGNOSIS — G932 Benign intracranial hypertension: Secondary | ICD-10-CM

## 2022-03-23 ENCOUNTER — Telehealth: Payer: Self-pay | Admitting: Neurology

## 2022-03-23 NOTE — Telephone Encounter (Signed)
Pt called in returning a call about results 

## 2022-03-23 NOTE — Telephone Encounter (Signed)
Called patient back. See Result note.

## 2022-03-26 ENCOUNTER — Telehealth: Payer: Self-pay | Admitting: Pharmacy Technician

## 2022-03-26 NOTE — Telephone Encounter (Signed)
Patient Advocate Encounter  Prior Authorization for Diclofenac Sodium 75MG  dr tablets has been approved.    PA# [id number] Key: [id number] Effective dates: 03/26/2022 through 03/26/2023      Lyndel Safe, [place] Patient Advocate Specialist [facility] Patient Advocate Team Direct Number: [phone]  Fax: [phone]

## 2022-04-09 ENCOUNTER — Inpatient Hospital Stay: Admission: RE | Admit: 2022-04-09 | Payer: Medicaid Other | Source: Ambulatory Visit

## 2022-04-10 ENCOUNTER — Ambulatory Visit
Admission: RE | Admit: 2022-04-10 | Discharge: 2022-04-10 | Disposition: A | Discharge status: Home / Self Care | Payer: Medicaid Other | Source: Ambulatory Visit | Attending: Neurology | Admitting: Neurology

## 2022-04-10 ENCOUNTER — Other Ambulatory Visit (HOSPITAL_COMMUNITY)
Admission: RE | Admit: 2022-04-10 | Discharge: 2022-04-10 | Disposition: A | Discharge status: Home / Self Care | Payer: Medicaid Other | Source: Ambulatory Visit | Attending: Neurology | Admitting: Neurology

## 2022-04-10 VITALS — BP 128/65 | HR 58

## 2022-04-10 DIAGNOSIS — G932 Benign intracranial hypertension: Secondary | ICD-10-CM | POA: Insufficient documentation

## 2022-04-10 NOTE — Discharge Instructions (Signed)

## 2022-04-11 NOTE — Progress Notes (Signed)
LMOVM fpr patient to call the office back in regards to her LP.

## 2022-04-12 ENCOUNTER — Telehealth: Payer: Self-pay | Admitting: Neurology

## 2022-04-12 ENCOUNTER — Telehealth: Payer: Self-pay

## 2022-04-12 LAB — CYTOLOGY - NON PAP

## 2022-04-12 NOTE — Telephone Encounter (Signed)
Patient left message with after hour service on 04-11-22 at 5:00 pm   Patient is returning [place] call

## 2022-04-12 NOTE — Telephone Encounter (Signed)
Patient advised of her LP results, Patient c/o back pain,when she lay down she gets a pressure headache. Around  2 pm she had ringing in her ears that lasted ten minutes. ( While Sitting up).   Please advise.

## 2022-04-12 NOTE — Telephone Encounter (Signed)
See Results encounter.

## 2022-04-12 NOTE — Telephone Encounter (Signed)
-----   Message from Pieter Partridge, DO sent at 04/11/2022  3:49 PM EST ----- Spinal fluid pressure is a little elevated.  If she has been taking the furosemide 20mg  daily and continues to have transient episodes of dimming vision, we can increase furosemide to 20mg  twice daily.  If agreeable, please send prescription for: 1. Furosemide 20mg  twice daily  2. KCl 10 mEq daily (potassium supplement) I would like to recheck a BMP in 2 weeks.

## 2022-04-12 NOTE — Telephone Encounter (Signed)
  I would take Tylenol for the back pain.  Headaches should improve over the next couple of days as more spinal fluid builds up again.  If headaches persist after 3 days, he can order a blood patch where we draw blood and inject it in the back where the needle was placed to "patch it up".      Patient advised.

## 2022-04-14 LAB — CSF CELL COUNT WITH DIFFERENTIAL
RBC Count, CSF: 0 cells/uL
TOTAL NUCLEATED CELL: 2 cells/uL (ref 0–5)

## 2022-04-14 LAB — CSF CULTURE W GRAM STAIN
MICRO NUMBER:: 14558005
Result:: NO GROWTH
SPECIMEN QUALITY:: ADEQUATE

## 2022-04-14 LAB — PROTEIN, CSF: Total Protein, CSF: 17 mg/dL (ref 15–45)

## 2022-04-14 LAB — GLUCOSE, CSF: Glucose, CSF: 56 mg/dL (ref 40–80)

## 2022-04-16 ENCOUNTER — Telehealth: Payer: Self-pay | Admitting: Neurology

## 2022-04-16 NOTE — Telephone Encounter (Signed)
Called [facility] and they are faxing stat lab results for this patient

## 2022-04-16 NOTE — Telephone Encounter (Signed)
Lacey Simon from [facility] called and left a message with the access nurse. She has lab results. Lab reference number [id number] Y.

## 2022-04-17 ENCOUNTER — Telehealth: Payer: Self-pay

## 2022-04-17 DIAGNOSIS — G932 Benign intracranial hypertension: Secondary | ICD-10-CM

## 2022-04-17 DIAGNOSIS — G971 Other reaction to spinal and lumbar puncture: Secondary | ICD-10-CM

## 2022-04-17 NOTE — Addendum Note (Signed)
Addended by: Venetia Night on: 04/17/2022 04:05 PM   Modules accepted: Orders

## 2022-04-17 NOTE — Telephone Encounter (Signed)
1. Positional headache improved laying down and double vision likely related to leak of spinal fluid.  Would order blood patch 2. As for back pain, all I can recommend is Tylenol or ibuprofen at this time.  What kind of pain is it?  Aching, muscle spasms? Per Patient the pain  will start in the middle of the back and travel up to the base of Lacey Simon head,  it feels like a Pressure headache. Like when you hang over the bed head down too long,.   3.  [facility] note is insufficient.  I want to refer Lacey Simon to another ophthalmology practice.

## 2022-04-17 NOTE — Telephone Encounter (Signed)
Per [facility] Add order for Blood Patch and they will call the patient to schedule it.

## 2022-04-17 NOTE — Telephone Encounter (Signed)
Patient complain of Double vision, Couldn't focus only could see color. Patient stated she did mentioned this to Provider at [facility] and was advised her vision has changed but was referred back to [facility].   Per Patient the headache constant one. With back pain. If she lays down with pillow prop her up some or her up then she is fine.  Patient also complains of back pain since LP. Patient states she mentioned this to [facility] and they advised her to let Dr.Jaffe know.

## 2022-04-17 NOTE — Telephone Encounter (Signed)
Patient came by the office, wanted to advise Dr.Jaffe that her vision has worsened, also states that she hasn't felt any better with other symptoms since the lumbar injection. Pollye mentioned last night she got in the car to drive and said she felt like she was drunk that nothing was clear unless it was a huge sign. Did drop off vision field paperwork as well that was done this morning I have placed into cubby.

## 2022-04-17 NOTE — Addendum Note (Signed)
Addended by: Venetia Night on: 04/17/2022 04:26 PM   Modules accepted: Orders

## 2022-04-18 ENCOUNTER — Other Ambulatory Visit: Payer: Self-pay | Admitting: Neurology

## 2022-04-18 ENCOUNTER — Other Ambulatory Visit: Payer: Self-pay

## 2022-04-18 DIAGNOSIS — G932 Benign intracranial hypertension: Secondary | ICD-10-CM

## 2022-04-18 MED ORDER — FUROSEMIDE 20 MG PO TABS: 20.0000 mg | ORAL_TABLET | Freq: Two times a day (BID) | ORAL | 5 refills | Status: DC

## 2022-04-18 MED ORDER — POTASSIUM CHLORIDE ER 10 MEQ PO TBCR: 10.0000 meq | EXTENDED_RELEASE_TABLET | Freq: Every day | ORAL | 5 refills | Status: AC

## 2022-04-18 NOTE — Progress Notes (Signed)
Per Dr.Jaffe, Spinal fluid pressure is a little elevated.  If she has been taking the furosemide 71m daily and continues to have transient episodes of dimming vision, we can increase furosemide to 221mtwice daily.  If agreeable, please send prescription for: 1. Furosemide 2068mwice daily  2. KCl 10 mEq daily (potassium supplement) I would like to recheck a BMP in 2 weeks.      Patient advised.

## 2022-04-20 ENCOUNTER — Ambulatory Visit
Admission: RE | Admit: 2022-04-20 | Discharge: 2022-04-20 | Disposition: A | Discharge status: Home / Self Care | Payer: Medicaid Other | Source: Ambulatory Visit | Attending: Neurology | Admitting: Neurology

## 2022-04-20 DIAGNOSIS — G971 Other reaction to spinal and lumbar puncture: Secondary | ICD-10-CM

## 2022-04-20 MED ORDER — IOPAMIDOL (ISOVUE-M 200) INJECTION 41%
1.0000 mL | Freq: Once | INTRAMUSCULAR | Status: AC
  Administered 2022-04-20: 1 mL via EPIDURAL

## 2022-04-20 NOTE — Progress Notes (Signed)
20cc blood collected from pts RAC prior to blood patch procedure. Pt tolerated well. 1 successful attempt. Gauze and tape applied after.

## 2022-04-20 NOTE — Discharge Instructions (Signed)
Blood Patch Discharge Instructions  Go home and rest quietly for the next 24 hours.  It is important to lie flat for the next 24 hours.  Get up only to go to the restroom.  You may lie in the bed or on a couch on your back, your stomach, your left side or your right side.  You may have one pillow under your head.  You may have pillows between your knees while you are on your side or under your knees while you are on your back.  DO NOT drive today.  Recline the seat as far back as it will go, while still wearing your seat belt, on the way home.  You may get up to go to the bathroom as needed.  You may sit up for 10 minutes to eat.  You may resume your normal diet and medications unless otherwise indicated.  Drink lots of extra fluids today and tomorrow..   You may resume normal activities after your 24 hours of bed rest is over; however, do not exert yourself strongly or do any heavy lifting tomorrow.  Call your physician for a follow-up appointment.   If you have any questions  after you arrive home, please call [phone].  Discharge instructions have been explained to the patient.  The patient, or the person responsible for the patient, fully understands these instructions.

## 2022-04-24 ENCOUNTER — Encounter: Payer: Self-pay | Admitting: Neurology

## 2022-05-01 ENCOUNTER — Encounter: Payer: Self-pay | Admitting: Neurology

## 2022-05-02 ENCOUNTER — Telehealth: Payer: Self-pay

## 2022-05-02 DIAGNOSIS — Z832 Family history of diseases of the blood and blood-forming organs and certain disorders involving the immune mechanism: Secondary | ICD-10-CM

## 2022-05-14 ENCOUNTER — Other Ambulatory Visit (INDEPENDENT_AMBULATORY_CARE_PROVIDER_SITE_OTHER): Payer: Medicaid Other

## 2022-05-14 DIAGNOSIS — G932 Benign intracranial hypertension: Secondary | ICD-10-CM

## 2022-05-14 DIAGNOSIS — Z832 Family history of diseases of the blood and blood-forming organs and certain disorders involving the immune mechanism: Secondary | ICD-10-CM | POA: Diagnosis not present

## 2022-05-14 LAB — BASIC METABOLIC PANEL
BUN: 4 mg/dL — ABNORMAL LOW (ref 6–23)
CO2: 27 mEq/L (ref 19–32)
Calcium: 8.2 mg/dL — ABNORMAL LOW (ref 8.4–10.5)
Chloride: 106 mEq/L (ref 96–112)
Creatinine, Ser: 0.67 mg/dL (ref 0.40–1.20)
GFR: 104.88 mL/min (ref >60.00)
Glucose, Bld: 85 mg/dL (ref 70–99)
Potassium: 3.4 mEq/L — ABNORMAL LOW (ref 3.5–5.1)
Sodium: 141 mEq/L (ref 135–145)

## 2022-05-15 ENCOUNTER — Other Ambulatory Visit (HOSPITAL_COMMUNITY): Payer: Self-pay

## 2022-05-17 LAB — VON WILLEBRAND ANTIGEN: Von Willebrand Antigen, Plasma: 130 % (ref 50–217)

## 2022-05-24 NOTE — Progress Notes (Signed)
LMOVm for patient to call the office back.  

## 2022-06-07 ENCOUNTER — Other Ambulatory Visit (HOSPITAL_COMMUNITY): Payer: Self-pay

## 2022-06-08 ENCOUNTER — Other Ambulatory Visit: Payer: Self-pay

## 2022-06-08 ENCOUNTER — Other Ambulatory Visit (HOSPITAL_COMMUNITY): Payer: Self-pay

## 2022-06-15 ENCOUNTER — Ambulatory Visit: Payer: Medicaid Other | Admitting: Neurology

## 2022-06-15 ENCOUNTER — Encounter: Payer: Self-pay | Admitting: Neurology

## 2022-07-26 ENCOUNTER — Other Ambulatory Visit (HOSPITAL_COMMUNITY): Payer: Self-pay

## 2022-08-07 NOTE — Progress Notes (Deleted)
NEUROLOGY FOLLOW UP OFFICE NOTE  Lacey Simon [id number]  Assessment/Plan:   Chronic migraine without aura, without status migrainosus, not intractable - she has at least 15 headache days a month for 3 consecutive months and has failed topiramate and amitriptyline Idiopathic intracranial hypertension - stable.  - unclear if it may be active - endorses occasional pulsatile tinnitus and brief visual obscurations. Restless leg syndrome Peripheral neuropathy   Migraine management Prevention:  *** Rescue:  diclofenac, promethazine and Benadryl Limit use of pain relievers to no more than 2 days out of week to prevent risk of rebound or medication-overuse headache. Keep headache diary Idiopathic intracranial hypertension Furosemide 20mg  twice daily KCl 10 mEq daily Check BMP Restless leg syndrome: Mirapex 1mg  at bedtime and gabapentin 600mg  at night Taking ferrous sulfate 325mg  daily.  Check ferritin level Follow up ***       Subjective:  Lacey Simon is a 47 year old female who follows up for migraines, IIH, RLS and neuropathy.   UPDATE: Restless Leg Syndrome: Current medications:  Mirapex 1mg  at bedtime; gabapentin 600mg  at night, ferrous sulfate 325mg  daily Ferritin level in August was 7.9.  She was advised to continue ferrous sulfate 325mg  daily but to contact her PCP about possible iron infusions.  She does not have a PCP.     Migraines: Rescue protocol:  diclofenac, Benadryl, promethazine She had one round of Botox in January.  ***   Idiopathic Intracranial Hypertension: Current medication:  furosemide 20mg  twice daily; KCl 10 mEq daily   Due to ongoing symptoms, she had a lumbar puncture on 2/13, which demonstrated an elevated opening pressure of 28 cm water.  Furosemide was increased to 20mg  twice daily.  K+ level on 3/18 was 3.4.  ***   Current NSAIDS/analgesics:  diclofenac 50mg  Current triptans:  none Current ergotamine:  none Current anti-emetic:   promethazine Current muscle relaxants: none Current Antihypertensive medications:  furosemide 20mg  daily Current Antidepressant medications:  none Current Anticonvulsant medications:  gabapentin 600mg  QHS (morning dose makes her sleepy) Current anti-CGRP:  none Current Vitamins/Herbal/Supplements:  none Current Antihistamines/Decongestants:  diphenhydramine Other therapy:  none Other medications:  Mirapex 1mg  at bedtime    HISTORY: Headaches since 2001 during pregnancy.  Diagnosed as migraines.  Started having tinnitus, pulsatile tinnitus and visual disturbance.  Diagnosed with idiopathic intracranial hypertension around 2014.  Followed by neurology and neuro-ophthalmology at the [facility] [place].  Initially treated with acetazolamide and topiramate which caused neuropathy and restless leg syndrome.  High doses of acetazolamide and topiramate caused calcium deficiency as well.  She was then switched to furosemide.  At one point had visual field defect where her neuro-ophthalmologist considered referral for VP shunt.  She underwent 3 LPs, the last in November 2018 revealed opening pressure of 35 cm H2O.  IIH was stable by 2019.  For restless leg and neuropathy symptoms, she took gabapentin 300mg  in AM and 600mg  at night as well as Mirapex 0.5mg  at bedtime.  She moved int [place] and has been off of her medications since 2021.  In early 2022, headaches returned.  She describes a bilateral retro-orbital pressure pain as well as pain on top of head radiating down and across forehead, pain in back of neck radiating up to front of head.  Worse when laying supine but improved upright.    MRI of brain without contrast on 01/01/2017 showed partially empty sella and slightly increased CSF surrounding the optic nerves, suggesting ongoing chronic intracranial hypertension.  MRI of brain  with and without contrast on 11/01/2017 was unchanged compared to prior imaging on 01/01/2017.   She also was evaluated by  neurology for episodic left sided spasms and jerking.  Routine EEG on 11/01/2017 was normal.         Past NSAIDS/analgesics:  diclofenac, naproxen, ibuprofen, tramadol, hydrocodone Past abortive triptans:  sumatriptan tab Past abortive ergotamine:  none Past muscle relaxants:  tizanidine Past anti-emetic:  Zofran, Compazine Past antihypertensive medications:  furosemide Past antidepressant medications:  amitriptyline Past anticonvulsant medications:  topiramate (paresthesias).  Would not use zonisamide as she has anaphylactic reaction to sulfa drugs. Past anti-CGRP:  none Past vitamins/Herbal/Supplements:  none Past antihistamines/decongestants:  Benadryl Other past therapies:  Diamox  PAST MEDICAL HISTORY: Past Medical History:  Diagnosis Date   Anxiety    COPD (chronic obstructive pulmonary disease) (HCC)    ETOH abuse    Fatigue    Hx of substance abuse (HCC)    IBS (irritable bowel syndrome)    Idiopathic intracranial hypertension    Lung nodule    Optic disc edema    Pseudotumor cerebri    Restless leg syndrome     MEDICATIONS: Current Outpatient Medications on File Prior to Visit  Medication Sig Dispense Refill   botulinum toxin Type A (BOTOX) 200 units injection Inject 155 units IM into multiple site in the face,neck and head once every 90 days 1 each 4   diclofenac (VOLTAREN) 75 MG EC tablet Take 1 tablet (75 mg total) by mouth 2 (two) times daily as needed. 15 tablet 5   diphenhydrAMINE (BENADRYL) 25 mg capsule Take by mouth.     furosemide (LASIX) 20 MG tablet Take 1 tablet (20 mg total) by mouth 2 (two) times daily. 60 tablet 5   gabapentin (NEURONTIN) 300 MG capsule Take 2 capsules (600 mg total) by mouth at bedtime. 60 capsule 5   HYDROcodone-acetaminophen (NORCO/VICODIN) 5-325 MG tablet Take 1 tablet by mouth every 4 (four) hours as needed. 10 tablet 0   medroxyPROGESTERone (PROVERA) 10 MG tablet Take 2 tablets (20 mg total) by mouth 3 (three) times daily for 7  days. 42 tablet 0   naproxen (NAPROSYN) 500 MG tablet Take 1 tablet (500 mg total) by mouth 2 (two) times daily with a meal. 21 tablet 0   omeprazole (PRILOSEC) 40 MG capsule Take 1 capsule by mouth daily.     potassium chloride (KLOR-CON) 10 MEQ tablet Take 1 tablet (10 mEq total) by mouth daily. 30 tablet 5   pramipexole (MIRAPEX) 0.5 MG tablet Take 2 tablets (1 mg total) by mouth every evening. 30 tablet 5   promethazine (PHENERGAN) 25 MG tablet Take 1 tablet (25 mg total) by mouth 2 (two) times daily as needed. 30 tablet 5   sucralfate (CARAFATE) 1 g tablet Take 1 tablet by mouth 2 (two) times a day.     No current facility-administered medications on file prior to visit.    ALLERGIES: Allergies  Allergen Reactions   Bee Venom Anaphylaxis   Penicillins Shortness Of Breath   Latex    Sulfa Antibiotics    Erythromycin Palpitations    FAMILY HISTORY: Family History  Problem Relation Age of Onset   Stroke Maternal Aunt    Dementia Maternal Uncle       Objective:  *** General: No acute distress.  Patient appears ***-groomed.   Head:  Normocephalic/atraumatic Eyes:  Fundi examined but not visualized Neck: supple, no paraspinal tenderness, full range of motion Heart:  Regular rate and  rhythm Lungs:  Clear to auscultation bilaterally Back: No paraspinal tenderness Neurological Exam: alert and oriented.  Speech fluent and not dysarthric, language intact.  CN II-XII intact. Bulk and tone normal, muscle strength 5/5 throughout.  Sensation to light touch intact.  Deep tendon reflexes 2+ throughout, toes downgoing.  Finger to nose testing intact.  Gait normal, Romberg negative.   Shon Millet, DO  CC: ***

## 2022-08-09 ENCOUNTER — Encounter: Payer: Self-pay | Admitting: Neurology

## 2022-08-09 ENCOUNTER — Ambulatory Visit: Payer: Medicaid Other | Admitting: Neurology

## 2022-08-28 ENCOUNTER — Other Ambulatory Visit (HOSPITAL_COMMUNITY): Payer: Self-pay

## 2022-08-31 ENCOUNTER — Other Ambulatory Visit (HOSPITAL_COMMUNITY): Payer: Self-pay

## 2022-09-14 ENCOUNTER — Ambulatory Visit: Payer: Medicaid Other | Admitting: Neurology

## 2022-10-02 ENCOUNTER — Encounter (HOSPITAL_COMMUNITY): Payer: Self-pay

## 2022-10-02 ENCOUNTER — Emergency Department (HOSPITAL_COMMUNITY)
Admission: EM | Admit: 2022-10-02 | Discharge: 2022-10-02 | Disposition: A | Discharge status: Home / Self Care | Payer: Medicaid Other | Attending: Student | Admitting: Student

## 2022-10-02 ENCOUNTER — Other Ambulatory Visit: Payer: Self-pay

## 2022-10-02 ENCOUNTER — Emergency Department (HOSPITAL_COMMUNITY): Payer: Medicaid Other

## 2022-10-02 DIAGNOSIS — G8929 Other chronic pain: Secondary | ICD-10-CM | POA: Insufficient documentation

## 2022-10-02 DIAGNOSIS — Z91040 Latex allergy status: Secondary | ICD-10-CM | POA: Diagnosis not present

## 2022-10-02 DIAGNOSIS — M25561 Pain in right knee: Secondary | ICD-10-CM | POA: Insufficient documentation

## 2022-10-02 MED ORDER — OXYCODONE HCL 5 MG PO TABS: 5.0000 mg | ORAL_TABLET | ORAL | 0 refills | Status: DC | PRN

## 2022-10-02 NOTE — ED Provider Notes (Signed)
[place] EMERGENCY DEPARTMENT AT [facility] Provider Note   CSN: [id number] Arrival date & time: 10/02/22  1101     History Chief Complaint  Patient presents with   Knee Pain    Lacey Simon is a 47 y.o. female with h/o chronic right knee pain presents to the ER for evaluation of right knee pain.  On Friday, the patient reports that she was coming down the stairs and she felt that her knee twisted but did not have any falls.  She reports that she has had "issues for years" and has had hairline fractures and meniscus injuries before.  She has been told before that she needs a knee replacement however reports that she could not do that she could not take this much time off work.  Reports she has been taking Goody powder and ibuprofen as well as trying ice.  She feels relief of her pain with Goody's powder and ice.  She denies any numbness or tingling distally.  Denies any leg swelling.  Denies any chest pain or shortness of breath.  Denies any fevers or any IV drug use.   Knee Pain Associated symptoms: no fever        Home Medications Prior to Admission medications   Medication Sig Start Date End Date Taking? Authorizing Provider  botulinum toxin Type A (BOTOX) 200 units injection Inject 155 units IM into multiple site in the face,neck and head once every 90 days 02/23/22   Drema Dallas, DO  diclofenac (VOLTAREN) 75 MG EC tablet Take 1 tablet (75 mg total) by mouth 2 (two) times daily as needed. 02/23/22   Drema Dallas, DO  diphenhydrAMINE (BENADRYL) 25 mg capsule Take by mouth. 09/18/19   [provider]  furosemide (LASIX) 20 MG tablet Take 1 tablet (20 mg total) by mouth 2 (two) times daily. 04/18/22   Drema Dallas, DO  gabapentin (NEURONTIN) 300 MG capsule Take 2 capsules (600 mg total) by mouth at bedtime. 02/23/22   Drema Dallas, DO  HYDROcodone-acetaminophen (NORCO/VICODIN) 5-325 MG tablet Take 1 tablet by mouth every 4 (four) hours as needed. 09/09/18    Burgess Amor, PA-C  medroxyPROGESTERone (PROVERA) 10 MG tablet Take 2 tablets (20 mg total) by mouth 3 (three) times daily for 7 days. 09/12/20 09/19/20  Jacalyn Lefevre, MD  naproxen (NAPROSYN) 500 MG tablet Take 1 tablet (500 mg total) by mouth 2 (two) times daily with a meal. 11/02/18   Caccavale, Sophia, PA-C  omeprazole (PRILOSEC) 40 MG capsule Take 1 capsule by mouth daily. 08/21/18   [provider]  potassium chloride (KLOR-CON) 10 MEQ tablet Take 1 tablet (10 mEq total) by mouth daily. 04/18/22   Drema Dallas, DO  pramipexole (MIRAPEX) 0.5 MG tablet Take 2 tablets (1 mg total) by mouth every evening. 02/23/22   Drema Dallas, DO  promethazine (PHENERGAN) 25 MG tablet Take 1 tablet (25 mg total) by mouth 2 (two) times daily as needed. 04/11/21   Drema Dallas, DO  sucralfate (CARAFATE) 1 g tablet Take 1 tablet by mouth 2 (two) times a day. 04/09/18   [provider]      Allergies    Bee venom, Penicillins, Latex, Sulfa antibiotics, and Erythromycin    Review of Systems   Review of Systems  Constitutional:  Negative for chills and fever.  Musculoskeletal:  Positive for arthralgias. Negative for joint swelling.  Neurological:  Negative for numbness.    Physical Exam Updated Vital  Signs BP 124/72 (BP Location: Right Arm)   Pulse 70   Temp 98.4 F (36.9 C) (Oral)   Resp 20   Ht 5\' 3"  (1.6 m)   Wt 62.6 kg   SpO2 98%   BMI 24.45 kg/m  Physical Exam Vitals and nursing note reviewed.  Constitutional:      General: She is not in acute distress.    Appearance: Normal appearance. She is not ill-appearing or toxic-appearing.  Eyes:     General: No scleral icterus. Pulmonary:     Effort: Pulmonary effort is normal. No respiratory distress.  Musculoskeletal:        General: Tenderness present.     Comments: On their right knee to the anterior aspect just inferior to the patella, she does have some tenderness palpation.  No real tenderness to the lateral medial aspect.   No superior tenderness to the anterior knee.  No posterior tenderness.  I do not appreciate any swelling, legs do appear symmetric.  She is palpable DP and PT pulses.  Compartments are soft.  Sensation and coloration intact and symmetric.  She is able to flex and extend the knee with some pain.  I do not appreciate any overlying swelling, erythema, warmth, or rashes.  Skin:    General: Skin is dry.  Neurological:     General: No focal deficit present.     Mental Status: She is alert. Mental status is at baseline.  Psychiatric:        Mood and Affect: Mood normal.     ED Results / Procedures / Treatments   Labs (all labs ordered are listed, but only abnormal results are displayed) Labs Reviewed - No data to display  EKG None  Radiology DG Knee Complete 4 Views Right  Result Date: 10/02/2022 CLINICAL DATA:  Pain EXAM: RIGHT KNEE - COMPLETE 4 VIEW COMPARISON:  None Available. FINDINGS: No evidence of fracture, dislocation, or joint effusion. No evidence of arthropathy or other focal bone abnormality. Soft tissues are unremarkable. IMPRESSION: No acute osseous abnormality Electronically Signed   By: Karen Kays M.D.   On: 10/02/2022 11:31    Procedures Procedures   Medications Ordered in ED Medications - No data to display  ED Course/ Medical Decision Making/ A&P                               Medical Decision Making Amount and/or Complexity of Data Reviewed Radiology: ordered.  Risk Prescription drug management.   47 y.o. female presents to the ER today for evaluation of right knee pain. Differential diagnosis includes but is not limited to sprain, strain, dislocation, fracture, tendon/ligament damage. Vital signs unremarkable. Physical exam as noted above.   XR imaging shows no acute osseous abnormality.  I do not see any overlying changes to suspect septic arthritis. Seh is not febrile, nor does she use any IV drugs. There is no overlying warmth or erythema. No fluctuance  or induration. She is neurovascularly intact distally. She is able to actively flex and extend some with pain. The patient has chronic right knee pain and has sustained multiple injuries to it over the years and has been told that she needs a knee replacement. She does see an orthopedic provider in [place]. I will provider her a knee immobilizer until she is able to be seen by them. She reports that she does not need crutches as she has these at home. I asked that  she be NWB. Recommended pain management regimen and discussed RICE as well. Overall, the patient is stable for discharge home.   We discussed plan at bedside. We discussed strict return precautions and red flag symptoms. The patient verbalized their understanding and agrees to the plan. The patient is stable and being discharged home in good condition.  Portions of this report may have been transcribed using voice recognition software. Every effort was made to ensure accuracy; however, inadvertent computerized transcription errors may be present.   Final Clinical Impression(s) / ED Diagnoses Final diagnoses:  Chronic pain of right knee    Rx / DC Orders ED Discharge Orders          Ordered    oxyCODONE (ROXICODONE) 5 MG immediate release tablet  Every 4 hours PRN        10/02/22 1202              Achille Rich, PA-C 10/04/22 1415    Kommor, [place], MD 10/04/22 [id number]

## 2022-10-02 NOTE — Discharge Instructions (Addendum)
You were seen in the ER today for evaluation of right knee pain. Your XR was unremarkable. I am giving you a knee immoblizer for you to wear when you are walking, however, I would like for you to use crutches at all times. I have also included information on the RICE method into this discharge paperwork. Please make sure you follow up with your orthopedic provider in [place] for further work up. If for whatever reason they can not see you, I have included the information for an orthopedic provider here. For pain, I recommend 1000mg  of Tylenol and 600mg  of ibuprofen every 6 hours as needed for pain. I am also prescribing you a few narcotic pain pills for breakthrough pain. Please do not drive or operate heavy machinery cause these can make you sleepy. You can also try over the counter lidocaine patches. If you have any concerns, new or worsening symptoms, please return to the nearest ER for re-evaluation.   Contact a doctor if: The knee pain does not stop. The knee pain changes or gets worse. You have a fever along with knee pain. Your knee is red or feels warm when you touch it. Your knee gives out or locks up. Get help right away if: Your knee swells, and the swelling gets worse. You cannot move your knee. You have very bad knee pain that does not get better with pain medicine.

## 2022-10-02 NOTE — ED Triage Notes (Signed)
Twisted knee on Friday Pt denies fall Pain 8/10 Denies numbness and tingling in toes  Told that she was bone on bone in the past

## 2022-10-09 ENCOUNTER — Encounter: Payer: Self-pay | Admitting: Orthopedic Surgery

## 2022-10-09 ENCOUNTER — Ambulatory Visit (INDEPENDENT_AMBULATORY_CARE_PROVIDER_SITE_OTHER): Payer: Medicaid Other | Admitting: Orthopedic Surgery

## 2022-10-09 DIAGNOSIS — G8929 Other chronic pain: Secondary | ICD-10-CM

## 2022-10-09 DIAGNOSIS — M25561 Pain in right knee: Secondary | ICD-10-CM | POA: Diagnosis not present

## 2022-10-09 MED ORDER — PREDNISONE 10 MG (21) PO TBPK: ORAL_TABLET | ORAL | 0 refills | Status: DC

## 2022-10-09 NOTE — Patient Instructions (Signed)
Please provide a note for work - out for the next 2 weeks

## 2022-10-09 NOTE — Progress Notes (Signed)
Xrays done in er.  8/9 she hyperextended her knee to keep from falling down the stairs.  No fractures. She was placed in a brace took her out of work for a week she is suppose to return tomorrow. They told her NWB and she is on crutches.  Constant pain with swelling. She was given oxy 5 only 5 days worth and told to take tylenol but nothing is helping she is using ice and elevating as much as possible.    Pain is more on the lateral side, she is swollen

## 2022-10-09 NOTE — Progress Notes (Signed)
New Patient Visit  Assessment: Lacey Simon is a 47 y.o. female with the following: 1. Chronic pain of right knee  Plan: Lacey Simon has acute pain in the right knee, in the setting of chronic right knee pain.  Radiographs are reassuring.  On physical exam, she has mild swelling.  She states that she has had injections in the past, but these have not provided sustained relief.  Negative Lachman.  No increased laxity varus or valgus stress.  I have urged her to continue to work on range of motion.  She does not need to use the brace, but she needs to maintain full extension.  Note provided to keep her out of work for the next 2 weeks.  I provided her with a short course of prednisone.  I would like to see her back in 6 weeks.  We will initiate physical therapy.  Follow-up: Return in about 6 weeks (around 11/20/2022).  Subjective:  Chief Complaint  Patient presents with   Knee Injury    R knee -  8/9 she hyperextended her knee     History of Present Illness: Lacey Simon is a 47 y.o. female who presents for evaluation of right knee pain.  Within the last week, she states that she hyperextended her right knee.  She was seen in the emergency department.  She was placed in a knee immobilizer.  She is using crutches.  She does have a history of chronic right knee pain.  She states that she has fractured her patella.  No prior surgeries on her right knee.  She notes a catching sensation periodically.  She has been taking oxycodone since evaluation in the emergency department.  This is not helping with her pain.   Review of Systems: No fevers or chills No numbness or tingling No chest pain No shortness of breath No bowel or bladder dysfunction No GI distress No headaches   Medical History:  Past Medical History:  Diagnosis Date   Anxiety    COPD (chronic obstructive pulmonary disease) (HCC)    ETOH abuse    Fatigue    Hx of substance abuse (HCC)    IBS (irritable  bowel syndrome)    Idiopathic intracranial hypertension    Lung nodule    Optic disc edema    Pseudotumor cerebri    Restless leg syndrome     Past Surgical History:  Procedure Laterality Date   CESAREAN SECTION     CHOLECYSTECTOMY     DENTAL SURGERY     TUBAL LIGATION      Family History  Problem Relation Age of Onset   Stroke Maternal Aunt    Dementia Maternal Uncle    Social History   Tobacco Use   Smoking status: Every Day    Current packs/day: 0.50    Types: Cigarettes   Smokeless tobacco: Never  Vaping Use   Vaping status: Every Day  Substance Use Topics   Alcohol use: Yes    Comment: occas   Drug use: Never    Allergies  Allergen Reactions   Bee Venom Anaphylaxis   Penicillins Shortness Of Breath   Latex    Sulfa Antibiotics    Erythromycin Palpitations    Current Meds  Medication Sig   predniSONE (STERAPRED UNI-PAK 21 TAB) 10 MG (21) TBPK tablet 10 mg DS 12 as directed    Objective: There were no vitals taken for this visit.  Physical Exam:  General: Alert and oriented. and No  acute distress. Gait: Right sided antalgic gait.  Evaluation of the right knee demonstrates mild swelling.  No bruising.  No redness.  She is tender over the lateral aspect of the knee.  No tenderness to palpation along the medial or lateral joint line.  Negative Lachman.  No increased laxity to varus or valgus stress.  She is able to flex the knee to approximately 70 degrees.  IMAGING: I personally reviewed images previously obtained from the ED  X-rays from the emergency department demonstrates no acute injury.  Mild joint space narrowing of the medial compartment.   New Medications:  Meds ordered this encounter  Medications   predniSONE (STERAPRED UNI-PAK 21 TAB) 10 MG (21) TBPK tablet    Sig: 10 mg DS 12 as directed    Dispense:  48 tablet    Refill:  0      Oliver Barre, MD  10/09/2022 10:34 AM

## 2022-10-23 NOTE — Therapy (Deleted)
OUTPATIENT PHYSICAL THERAPY LOWER EXTREMITY EVALUATION  Patient Name: Lacey Simon MRN: [id number] DOB:Nov 11, 1975, 47 y.o., female Today's Date: 10/23/2022    Past Medical History:  Diagnosis Date   Anxiety    COPD (chronic obstructive pulmonary disease) (HCC)    ETOH abuse    Fatigue    Hx of substance abuse (HCC)    IBS (irritable bowel syndrome)    Idiopathic intracranial hypertension    Lung nodule    Optic disc edema    Pseudotumor cerebri    Restless leg syndrome    Past Surgical History:  Procedure Laterality Date   CESAREAN SECTION     CHOLECYSTECTOMY     DENTAL SURGERY     TUBAL LIGATION     There are no problems to display for this patient.   PCP: Pcp, No  REFERRING PROVIDER: Oliver Barre, MD  THERAPY DIAG:  No diagnosis found.  REFERRING DIAG: ***  Rationale for Evaluation and Treatment:  Rehabilitation  SUBJECTIVE:  PERTINENT PAST HISTORY:  ***        PRECAUTIONS: {Therapy precautions:24002}  WEIGHT BEARING RESTRICTIONS {Yes ***/No:24003}  FALLS:  Has patient fallen in last 6 months? {yes/no:20286}, Number of falls: ***  MOI/History of condition:  Onset date: ***  SUBJECTIVE STATEMENT  Lacey Simon is a 47 y.o. female who presents to clinic with chief complaint of ***.  ***   Red flags:  {has/denies:26543} {kerredflag:26542}  Pain:  Are you having pain? {yes/no:20286} Pain location: *** NPRS scale:  {NUMBERS; 0-10:5044}/10 to {NUMBERS; 0-10:5044}/10 Aggravating factors: *** Relieving factors: *** Pain description: {PAIN DESCRIPTION:21022940} Stage: {Desc; acute/subacute/chronic:13799} 24 hour pattern: ***   Occupation: ***  Assistive Device: ***  Hand Dominance: ***  Patient Goals/Specific Activities: ***   OBJECTIVE:   DIAGNOSTIC FINDINGS:  ***   GENERAL OBSERVATION/GAIT:   ***  SENSATION:  Light touch: {intact/deficits:24005}  PALPATION: ***  MUSCLE LENGTH: Hamstrings: Right  {kerminsig:27227} restriction; Left {kerminsig:27227} restriction ASLR: Right {keraslr:27228}; Left {keraslr:27228} Maisie Fus test: Right {kerminsig:27227} restriction; Left {kerminsig:27227} restriction Ely's test: Right {kerminsig:27227} restriction; Left {kerminsig:27227} restriction  LE MMT:  MMT Right (Eval) Left (Eval)  Hip flexion (L2, L3) *** ***  Knee extension (L3) *** ***  Knee flexion *** ***  Hip abduction *** ***  Hip extension *** ***  Hip external rotation    Hip internal rotation    Hip adduction    Ankle dorsiflexion (L4)    Ankle plantarflexion (S1)    Ankle inversion    Ankle eversion    Great Toe ext (L5)    Grossly     (Blank rows = not tested, score listed is out of 5 possible points.  N = WNL, D = diminished, C = clear for gross weakness with myotome testing, * = concordant pain with testing)  LE ROM:  ROM Right (Eval) Left (Eval)  Hip flexion    Hip extension    Hip abduction    Hip adduction    Hip internal rotation    Hip external rotation    Knee extension    Knee flexion    Ankle dorsiflexion    Ankle plantarflexion    Ankle inversion    Ankle eversion     (Blank rows = not tested, N = WNL, * = concordant pain with testing)  Functional Tests  Eval (10/23/2022)  SPECIAL TESTS:   ***   PATIENT SURVEYS:  FOTO ***   TODAY'S TREATMENT: ***   PATIENT EDUCATION:  POC, diagnosis, prognosis, HEP, and outcome measures.  Pt educated via explanation, demonstration, and handout (HEP).  Pt confirms understanding verbally.   HOME EXERCISE PROGRAM: ***  Treatment priorities   Eval                                                  ASSESSMENT:  CLINICAL IMPRESSION: Lacey Simon is a 47 y.o. female who presents to clinic with signs and sxs consistent with ***.    OBJECTIVE IMPAIRMENTS: Pain, ***  ACTIVITY LIMITATIONS: ***  PERSONAL FACTORS: See medical  history and pertinent history   REHAB POTENTIAL: {rehabpotential:25112}  CLINICAL DECISION MAKING: {clinical decision making:25114}  EVALUATION COMPLEXITY: {Evaluation complexity:25115}   GOALS:   SHORT TERM GOALS: Target date: ***  Lacey Simon will be >75% HEP compliant to improve carryover between sessions and facilitate independent management of condition  Evaluation: ongoing Goal status: INITIAL   LONG TERM GOALS: Target date: ***  Lacey Simon will improve FOTO score to *** as a proxy for functional improvement  Evaluation/Baseline: *** Goal status: INITIAL    2.  ***   3.  ***   4.  ***   5.  ***   6.  ***   PLAN: PT FREQUENCY: 1-2x/week  PT DURATION: 8 weeks  PLANNED INTERVENTIONS: Therapeutic exercises, Aquatic therapy, Therapeutic activity, Neuro Muscular re-education, Gait training, Patient/Family education, Joint mobilization, Dry Needling, Electrical stimulation, Spinal mobilization and/or manipulation, Moist heat, Taping, Vasopneumatic device, Ionotophoresis 4mg /ml Dexamethasone, and Manual therapy   Alphonzo Severance PT, DPT 10/23/2022, 9:13 AM

## 2022-10-24 ENCOUNTER — Ambulatory Visit: Payer: Medicaid Other | Admitting: Physical Therapy

## 2022-11-08 ENCOUNTER — Other Ambulatory Visit: Payer: Self-pay | Admitting: Neurology

## 2022-11-13 ENCOUNTER — Other Ambulatory Visit (HOSPITAL_COMMUNITY): Payer: Self-pay

## 2022-11-15 ENCOUNTER — Other Ambulatory Visit (HOSPITAL_COMMUNITY): Payer: Self-pay

## 2022-11-20 ENCOUNTER — Ambulatory Visit: Payer: Medicaid Other | Admitting: Orthopedic Surgery

## 2022-12-09 ENCOUNTER — Other Ambulatory Visit: Payer: Self-pay | Admitting: Neurology

## 2022-12-09 DIAGNOSIS — G932 Benign intracranial hypertension: Secondary | ICD-10-CM

## 2022-12-10 ENCOUNTER — Other Ambulatory Visit: Payer: Self-pay | Admitting: Neurology

## 2022-12-10 ENCOUNTER — Telehealth: Payer: Self-pay

## 2022-12-10 MED ORDER — GABAPENTIN 300 MG PO CAPS: 600.0000 mg | ORAL_CAPSULE | Freq: Every day | ORAL | 5 refills | Status: DC

## 2022-12-10 NOTE — Telephone Encounter (Signed)
Telephone call to patient, To see if she still wanted Botox. Botox in Fridge since last ordered in April 2024.  Per patient she has a lot going on right now. Hasn't had a chance to schedule anything.   Patient transferred to the front  desk to schedule a follow up visit for Botox and refills on Mirapex, and gabapentin.

## 2022-12-17 ENCOUNTER — Other Ambulatory Visit: Payer: Self-pay

## 2022-12-27 NOTE — Progress Notes (Deleted)
NEUROLOGY FOLLOW UP OFFICE NOTE  Lacey Simon [id number]  Assessment/Plan:   Chronic migraine without aura, without status migrainosus, not intractable Idiopathic intracranial hypertension - stable.  - unclear if it may be active - endorses occasional pulsatile tinnitus and brief visual obscurations. Restless leg syndrome Peripheral neuropathy   Migraine management Prevention:  *** Rescue:  diclofenac, promethazine and Benadryl Limit use of pain relievers to no more than 2 days out of week to prevent risk of rebound or medication-overuse headache. Keep headache diary Idiopathic intracranial hypertension Furosemide 20mg  twice daily.. Check BMP Restless leg syndrome: Mirapex 1mg  at bedtime and gabapentin 600mg  at night Taking ferrous sulfate 325mg  daily.  Check ferritin level Follow up for first round of Botox  Subjective:  Lacey Simon is a 47 year old female who follows up for migraines, IIH, RLS and neuropathy.   UPDATE: Last seen in December 2023.  Restless Leg Syndrome: Current medications:  Mirapex 1mg  at bedtime; gabapentin 600mg  at night, ferrous sulfate 325mg  daily Ferritin level from last December was 9.3.  She had already been taking oral ferrous sulfate.  Advised to establish care with a PCP for infusions.  ***    Migraines: Rescue protocol:  diclofenac, Benadryl, promethazine She had one round of Botox in January.  She stated that she had a lot happening and wasn't able to reschedule further appointments.     Idiopathic Intracranial Hypertension: Current medication:  furosemide 20mg  twice daily.   Last December, she reported intermittent dizziness (positional spinning).  On a couple of occasions, she heard "whooshing sound" while laying down a couple of times.  Last eye exam in April was fine.  Also infrequently, she may note brief dimming of vision.  She is on furosemide but may skip doses because she forgets to take it.  She urinates frequently.  CT head on  03/22/2022 personally reviewed again demonstrated a partially empty sella turcica but otherwise unremarkable.  LP on 2/13 revealed opening pressure of 28 cm water.  She was advised to increase furosemide to 20mg  twice daily and start KCl daily.  She did get a post-LP headache requiring a blood patch.  She saw her optometrist on 2/20 ***.  Repeat BMP in March revealed K 3.4.  She was then lost to follow up.   Current NSAIDS/analgesics:  diclofenac 50mg  Current triptans:  none Current ergotamine:  none Current anti-emetic:  promethazine Current muscle relaxants: none Current Antihypertensive medications:  furosemide 20mg  daily Current Antidepressant medications:  none Current Anticonvulsant medications:  gabapentin 600mg  QHS (morning dose makes her sleepy) Current anti-CGRP:  none Current Vitamins/Herbal/Supplements:  none Current Antihistamines/Decongestants:  diphenhydramine Other therapy:  none Other medications:  Mirapex 1mg  at bedtime   10/09/2021 BMP:  Na 136, K 3.8, Cl 102, CO2 25, glucose 103, BUN 7, Cr 0.83, Ca 8.4, GFR 84.95   HISTORY: Headaches since 2001 during pregnancy.  Diagnosed as migraines.  Started having tinnitus, pulsatile tinnitus and visual disturbance.  Diagnosed with idiopathic intracranial hypertension around 2014.  Followed by neurology and neuro-ophthalmology at the [facility] [place].  Initially treated with acetazolamide and topiramate which caused neuropathy and restless leg syndrome.  High doses of acetazolamide and topiramate caused calcium deficiency as well.  She was then switched to furosemide.  At one point had visual field defect where her neuro-ophthalmologist considered referral for VP shunt.  She underwent 3 LPs, the last in November 2018 revealed opening pressure of 35 cm H2O.  IIH was stable by 2019.  For  restless leg and neuropathy symptoms, she took gabapentin 300mg  in AM and 600mg  at night as well as Mirapex 0.5mg  at bedtime.  She moved int [place]  and has been off of her medications since 2021.  In early 2022, headaches returned.  She describes a bilateral retro-orbital pressure pain as well as pain on top of head radiating down and across forehead, pain in back of neck radiating up to front of head.  Worse when laying supine but improved upright.    MRI of brain without contrast on 01/01/2017 showed partially empty sella and slightly increased CSF surrounding the optic nerves, suggesting ongoing chronic intracranial hypertension.  MRI of brain with and without contrast on 11/01/2017 was unchanged compared to prior imaging on 01/01/2017.   She also was evaluated by neurology for episodic left sided spasms and jerking.  Routine EEG on 11/01/2017 was normal.         Past NSAIDS/analgesics:  diclofenac, naproxen, ibuprofen, tramadol, hydrocodone Past abortive triptans:  sumatriptan tab Past abortive ergotamine:  none Past muscle relaxants:  tizanidine Past anti-emetic:  Zofran, Compazine Past antihypertensive medications:  furosemide Past antidepressant medications:  amitriptyline Past anticonvulsant medications:  topiramate (paresthesias).  Would not use zonisamide as she has anaphylactic reaction to sulfa drugs. Past anti-CGRP:  none Past vitamins/Herbal/Supplements:  none Past antihistamines/decongestants:  Benadryl Other past therapies:  Diamox  PAST MEDICAL HISTORY: Past Medical History:  Diagnosis Date   Anxiety    COPD (chronic obstructive pulmonary disease) (HCC)    ETOH abuse    Fatigue    Hx of substance abuse (HCC)    IBS (irritable bowel syndrome)    Idiopathic intracranial hypertension    Lung nodule    Optic disc edema    Pseudotumor cerebri    Restless leg syndrome     MEDICATIONS: Current Outpatient Medications on File Prior to Visit  Medication Sig Dispense Refill   botulinum toxin Type A (BOTOX) 200 units injection Inject 155 units IM into multiple site in the face,neck and head once every 90 days 1 each 4    diclofenac (VOLTAREN) 75 MG EC tablet Take 1 tablet (75 mg total) by mouth 2 (two) times daily as needed. 15 tablet 5   diphenhydrAMINE (BENADRYL) 25 mg capsule Take by mouth.     furosemide (LASIX) 20 MG tablet TAKE 1 TABLET BY MOUTH EVERY DAY 90 tablet 1   gabapentin (NEURONTIN) 300 MG capsule Take 2 capsules (600 mg total) by mouth at bedtime. 60 capsule 5   medroxyPROGESTERone (PROVERA) 10 MG tablet Take 2 tablets (20 mg total) by mouth 3 (three) times daily for 7 days. 42 tablet 0   omeprazole (PRILOSEC) 40 MG capsule Take 1 capsule by mouth daily.     oxyCODONE (ROXICODONE) 5 MG immediate release tablet Take 1 tablet (5 mg total) by mouth every 4 (four) hours as needed for severe pain. 7 tablet 0   potassium chloride (KLOR-CON) 10 MEQ tablet Take 1 tablet (10 mEq total) by mouth daily. 30 tablet 5   pramipexole (MIRAPEX) 0.5 MG tablet TAKE 2 TABLETS (1 MG TOTAL) BY MOUTH EVERY EVENING. 180 tablet 0   predniSONE (STERAPRED UNI-PAK 21 TAB) 10 MG (21) TBPK tablet 10 mg DS 12 as directed 48 tablet 0   promethazine (PHENERGAN) 25 MG tablet TAKE 1 TABLET BY MOUTH 2 TIMES DAILY AS NEEDED. 30 tablet 4   No current facility-administered medications on file prior to visit.    ALLERGIES: Allergies  Allergen Reactions  Bee Venom Anaphylaxis   Penicillins Shortness Of Breath   Latex    Sulfa Antibiotics    Erythromycin Palpitations    FAMILY HISTORY: Family History  Problem Relation Age of Onset   Stroke Maternal Aunt    Dementia Maternal Uncle       Objective:  *** General: No acute distress.  Patient appears ***-groomed.   Head:  Normocephalic/atraumatic Eyes:  Fundi examined but not visualized Neck: supple, no paraspinal tenderness, full range of motion Heart:  Regular rate and rhythm Lungs:  Clear to auscultation bilaterally Back: No paraspinal tenderness Neurological Exam: alert and oriented.  Speech fluent and not dysarthric, language intact.  CN II-XII intact. Bulk and tone  normal, muscle strength 5/5 throughout.  Sensation to light touch intact.  Deep tendon reflexes 2+ throughout, toes downgoing.  Finger to nose testing intact.  Gait normal, Romberg negative.   Shon Millet, DO  CC: ***

## 2022-12-31 ENCOUNTER — Ambulatory Visit: Payer: Medicaid Other | Admitting: Neurology

## 2023-01-16 ENCOUNTER — Other Ambulatory Visit: Payer: Self-pay

## 2023-01-17 ENCOUNTER — Other Ambulatory Visit: Payer: Self-pay

## 2023-02-12 ENCOUNTER — Other Ambulatory Visit: Payer: Self-pay

## 2023-02-12 NOTE — Progress Notes (Signed)
Disenrolling - Office still has Botox from April 2024 and no future visits are scheduled at this time to administer medication.

## 2023-03-15 ENCOUNTER — Ambulatory Visit
Admission: RE | Admit: 2023-03-15 | Discharge: 2023-03-15 | Disposition: A | Discharge status: Home / Self Care | Payer: Medicaid Other | Source: Ambulatory Visit | Attending: Registered Nurse | Admitting: Registered Nurse

## 2023-03-15 ENCOUNTER — Other Ambulatory Visit: Payer: Self-pay | Admitting: Registered Nurse

## 2023-03-15 DIAGNOSIS — J189 Pneumonia, unspecified organism: Secondary | ICD-10-CM

## 2023-03-17 ENCOUNTER — Other Ambulatory Visit: Payer: Self-pay | Admitting: Neurology

## 2023-04-02 NOTE — Telephone Encounter (Signed)
 Labs ordered.

## 2023-05-21 ENCOUNTER — Encounter: Payer: Self-pay | Admitting: Neurology

## 2023-05-28 HISTORY — PX: KNEE SURGERY: SHX244

## 2023-06-12 ENCOUNTER — Other Ambulatory Visit: Payer: Self-pay | Admitting: Neurology

## 2023-06-20 ENCOUNTER — Ambulatory Visit: Payer: Medicaid Other | Admitting: Neurology

## 2023-07-16 ENCOUNTER — Encounter: Payer: Self-pay | Admitting: *Deleted

## 2023-07-17 ENCOUNTER — Encounter: Payer: Self-pay | Admitting: Neurology

## 2023-07-17 ENCOUNTER — Ambulatory Visit (INDEPENDENT_AMBULATORY_CARE_PROVIDER_SITE_OTHER): Admitting: Neurology

## 2023-07-17 VITALS — BP 133/78 | HR 93 | Ht 63.0 in | Wt 154.0 lb

## 2023-07-17 DIAGNOSIS — Z9189 Other specified personal risk factors, not elsewhere classified: Secondary | ICD-10-CM

## 2023-07-17 DIAGNOSIS — Z8669 Personal history of other diseases of the nervous system and sense organs: Secondary | ICD-10-CM

## 2023-07-17 DIAGNOSIS — G43709 Chronic migraine without aura, not intractable, without status migrainosus: Secondary | ICD-10-CM | POA: Diagnosis not present

## 2023-07-17 DIAGNOSIS — G932 Benign intracranial hypertension: Secondary | ICD-10-CM

## 2023-07-17 DIAGNOSIS — Z789 Other specified health status: Secondary | ICD-10-CM | POA: Diagnosis not present

## 2023-07-17 DIAGNOSIS — H539 Unspecified visual disturbance: Secondary | ICD-10-CM

## 2023-07-17 NOTE — Progress Notes (Signed)
 Subjective:    Patient ID: Lacey Simon is a 48 y.o. female.  HPI    Debbra Fairy, MD, PhD [facility] [address] [address] [place], [place] [address]  Dear Jud Norris,   I saw your patient, Malinda Mayden, upon your kind request in my neurologic clinic today for initial consultation of her recurrent headaches, including history of migraines and age.  She also reports a history of restless leg syndrome.  The patient is unaccompanied today.  As you know, Ms. Hush is a 48 year old female with an underlying medical history of IIH, migraine headaches, smoking, restless leg syndrome, anxiety, irritable bowel syndrome, arthritis, status post arthroscopic knee surgery, COPD, reflux disease, and overweight state, who reports a longstanding history of pseudotumor cerebri.  She reports recurrent headaches.  She has been seeing [place] neurology, Dr. Festus Hubert, but no longer wants to go back to [facility] neurology.  She previously saw a neurologist out of [place] .  She has had Botox  injections about twice under Dr. Festus Hubert but does not feel that the Botox  injections.  I reviewed your office note from 03/15/2023.  She is on multiple medications, she has been on pramipexole , gabapentin , she has tried Diamox and Topamax for her pseudotumor cerebri but reports that she side effects from these medications but also reports that the Diamox was high dose.  Currently not on Diamox or Topamax.  She is on pramipexole  0.5 mg at bedtime and gabapentin .  She had a Botox  injection on 03/09/2022 under Dr. Festus Hubert.  I reviewed office visit notes in her electronic chart.  She had a lumbar puncture in February 2020 for which showed an opening pressure of 28.  She goes to [facility] eye care but has not seen them in over 6 months.  Last eye examination was with [facility] and she has recent updated eye exam.  She has occasional floaters in her right eye.  She denies any sudden onset of one-sided weakness  or numbness or tingling or droopy face or slurring of speech.  She had a head CT without contrast on 03/22/2022 and I reviewed the results:    IMPRESSION: 1. Partially empty sella turcica. This finding can reflect incidental anatomic variation, or alternatively, it can be associated with idiopathic intracranial hypertension (pseudotumor cerebri). 2. Otherwise unremarkable non-contrast CT appearance of the brain. 3. 12 mm mucous retention cyst within the left sphenoid sinus.   She tries to hydrate well.  She reports drinking about 2-3 bottles of water per day, she drinks quite a bit of caffeine in the form of tea and soda, 2 to 3 glasses of tea per day and 1 to 2 16.9 ounce bottles of soda a day.  She does not keep a very send sleep schedule.  She is not sure if she snores.  She has never a sleep study.  Bedtime can vary from 9:45 PM to 4 AM and rise time is generally between 9 and 10.  She works in Engineering geologist.  Her Past Medical History Is Significant For: Past Medical History:  Diagnosis Date   Anxiety    COPD (chronic obstructive pulmonary disease) (HCC)    ETOH abuse    Fatigue    Hx of substance abuse (HCC)    IBS (irritable bowel syndrome)    Idiopathic intracranial hypertension    Lung nodule    Optic disc edema    Pseudotumor cerebri    Restless leg syndrome     Her Past Surgical History  Is Significant For: Past Surgical History:  Procedure Laterality Date   CESAREAN SECTION     CHOLECYSTECTOMY     DENTAL SURGERY     KNEE SURGERY Right 05/2023   meniscectomy, removed loose bone fragments, arthroscopy   lumbar punctures     x4-5, last February 2024   TUBAL LIGATION      Her Family History Is Significant For: Family History  Problem Relation Age of Onset   Stroke Maternal Aunt    Dementia Maternal Uncle    Diabetes Other    Cancer Other    Heart Problems Other    Migraines Neg Hx    Pseudotumor cerebri Neg Hx     Her Social History Is Significant For: Social  History   Socioeconomic History   Marital status: Married    Spouse name: Not on file   Number of children: Not on file   Years of education: Not on file   Highest education level: Not on file  Occupational History   Not on file  Tobacco Use   Smoking status: Every Day    Current packs/day: 0.50    Types: Cigarettes   Smokeless tobacco: Never  Vaping Use   Vaping status: Every Day  Substance and Sexual Activity   Alcohol use: Not Currently    Comment: occas, none since November 2024   Drug use: Never   Sexual activity: Not on file  Other Topics Concern   Not on file  Social History Narrative   Right handed   Caffeine: sweet tea and pepsi daily (1-2 glasses of tea, 1 pepsi)   Drinks 2-3 bottles of water each day and mixes flavoring in   Social Drivers of Health   Financial Resource Strain: Low Risk  (03/29/2023)   Received from [facility]   Overall Financial Resource Strain (CARDIA)    Difficulty of Paying Living Expenses: Not very hard  Food Insecurity: No Food Insecurity (03/29/2023)   [facility]   Hunger Vital Sign    Worried About Running Out of Food in the Last Year: Never true    Ran Out of Food in the Last Year: Never true  Transportation Needs: No Transportation Needs (03/29/2023)   Received from [facility] - [facility]    Lack of Transportation (Medical): No    Lack of Transportation (Non-Medical): No  Physical Activity: Not on file  Stress: Not on file  Social Connections: Unknown (07/11/2021)   Received from [facility]   Social Network    Social Network: Not on file    Her Allergies Are:  Allergies  Allergen Reactions   Bee Venom Anaphylaxis   Penicillins Shortness Of Breath   Sulfa Antibiotics Anaphylaxis and Shortness Of Breath   Erythromycin Palpitations   Latex Swelling and Rash  :   Her Current Medications Are:  Outpatient Encounter Medications as of 07/17/2023  Medication Sig    albuterol (VENTOLIN HFA) 108 (90 Base) MCG/ACT inhaler Inhale 2 puffs into the lungs 2 (two) times daily.   budesonide-formoterol (SYMBICORT) 80-4.5 MCG/ACT inhaler Inhale 1 puff into the lungs daily.   diclofenac  (VOLTAREN ) 75 MG EC tablet Take 1 tablet (75 mg total) by mouth 2 (two) times daily as needed.   diphenhydrAMINE (BENADRYL) 25 mg capsule Take by mouth.   fluticasone (FLONASE) 50 MCG/ACT nasal spray Place 1 spray into both nostrils daily.   furosemide  (LASIX ) 20 MG tablet TAKE 1 TABLET BY MOUTH EVERY DAY  gabapentin  (NEURONTIN ) 300 MG capsule TAKE 2 CAPSULES BY MOUTH AT BEDTIME.   pramipexole  (MIRAPEX ) 0.5 MG tablet TAKE 2 TABLETS (1 MG TOTAL) BY MOUTH EVERY EVENING.   promethazine  (PHENERGAN ) 25 MG tablet TAKE 1 TABLET BY MOUTH 2 TIMES DAILY AS NEEDED.   omeprazole (PRILOSEC) 40 MG capsule Take 1 capsule by mouth daily. (Patient not taking: Reported on 07/17/2023)   potassium chloride  (KLOR-CON ) 10 MEQ tablet Take 1 tablet (10 mEq total) by mouth daily. (Patient not taking: Reported on 07/17/2023)   [DISCONTINUED] botulinum toxin Type A  (BOTOX ) 200 units injection Inject 155 units IM into multiple site in the face,neck and head once every 90 days   [DISCONTINUED] medroxyPROGESTERone  (PROVERA ) 10 MG tablet Take 2 tablets (20 mg total) by mouth 3 (three) times daily for 7 days.   [DISCONTINUED] oxyCODONE  (ROXICODONE ) 5 MG immediate release tablet Take 1 tablet (5 mg total) by mouth every 4 (four) hours as needed for severe pain.   [DISCONTINUED] predniSONE  (STERAPRED UNI-PAK 21 TAB) 10 MG (21) TBPK tablet 10 mg DS 12 as directed   No facility-administered encounter medications on file as of 07/17/2023.  :   Review of Systems:  Out of a complete 14 point review of systems, all are reviewed and negative with the exception of these symptoms as listed below:  Review of Systems  Neurological:        Patient is here alone for referral for chronic migraine and RLS. Pt states in 2013 she was  diagnosed with pseudotumor cerebri. She is transferring care from [facility] for migraines and she states she has neuropathy symptoms at night that were brought on while she was on Diamox and Topiramate. Migraines can be triggered by the weather. Migraines started around 2001 when she was pregnant with her oldest son. Patient states initially they said it was her SBP but she states it was never above 118. In 2013, patient was having trouble with vision going black when she stood and also having floaters. She had optic nerve edema and was sent for a CT scan. She went straight to the ER. Patient states she has been in remission since 2019. She had a lumbar puncture by Dr Festus Hubert February 2024. Dr Odean Bend was her neuro-ophthalmologist and Dr Corliss Dies was her neurologist. She moved here from [place] 5 yrs ago. She tries to keep her weight below 150 lb because if her weight rises she states she will have increased ICP. For acute migraines where Ibuprofen  doesn't help, she takes Diclofenac , Benadryl and Phenergan  all at once and rest. The last time she has had to do that was right before she had her LP last year. If she lays down with less than 3 pillows she will get whooshing in her ears. She would like to stop smoking.    Objective:  Neurological Exam  Physical Exam Physical Examination:   Vitals:   07/17/23 1443  BP: 133/78  Pulse: 93    General Examination: The patient is a very pleasant 48 y.o. female in no acute distress. She appears well-developed and well-nourished and well groomed.   HEENT: Normocephalic, atraumatic, pupils are equal, round and reactive to light, no photophobia.  Funduscopic exam benign.  She has prescription eyeglasses.  Extraocular tracking is good without limitation to gaze excursion or nystagmus noted. Hearing is grossly intact. Face is symmetric with normal facial animation. Speech is clear with no dysarthria noted. There is no hypophonia. There is no lip, neck/head, jaw or voice  tremor. Neck is supple  with full range of passive and active motion. There are no carotid bruits on auscultation. Oropharynx exam reveals: mild mouth dryness, adequate dental hygiene with dentures on top and edentulous on the bottom, moderate airway crowding secondary to small airway entry.  Tongue protrudes centrally and palate elevates symmetrically, neck circumference 14-7/8 inches.    Chest: Clear to auscultation without wheezing, rhonchi or crackles noted.  Heart: S1+S2+0, regular and normal without murmurs, rubs or gallops noted.   Abdomen: Soft, non-tender and non-distended.  Extremities: There is mild lower extremity swelling right more than left, trace on the left and 1+ on the right.    Skin: Warm and dry without trophic changes noted.   Musculoskeletal: exam reveals no obvious joint deformities, decreased range of motion right knee, unremarkable arthroscopic scars right knee.   Neurologically:  Mental status: The patient is awake, alert and oriented in all 4 spheres. Her immediate and remote memory, attention, language skills and fund of knowledge are appropriate. There is no evidence of aphasia, agnosia, apraxia or anomia. Speech is clear with normal prosody and enunciation. Thought process is linear. Mood is normal and affect is normal.  Cranial nerves II - XII are as described above under HEENT exam.  Motor exam: Normal bulk, strength and tone is noted. There is no obvious action or resting tremor.  Fine motor skills and coordination: grossly intact.  Cerebellar testing: No dysmetria or intention tremor. There is no truncal or gait ataxia.  Normal finger-to-nose, normal heel-to-shin, decreased range of motion with the right leg due to recent knee surgery. Sensory exam: intact to light touch in the upper and lower extremities.  Gait, station and balance: She stands easily. No veering to one side is noted. No leaning to one side is noted. Posture is age-appropriate and stance is  narrow based. Gait shows normal stride length and normal pace. No problems turning are noted.   Assessment and Plan:   In summary, Danyla Wattley is a very pleasant 48 y.o.-year old female 48 year old female with an underlying medical history of IIH, migraine headaches, smoking, restless leg syndrome, anxiety, irritable bowel syndrome, arthritis, status post arthroscopic knee surgery, COPD, reflux disease, and overweight state, who presents for evaluation of her recurrent headaches of several years duration.  She has a longstanding history of pseudotumor cerebri and had workup for this in the past.  Her last lumbar puncture from a year ago did show an elevated opening pressure of 28 cm.  She is advised to restart Diamox at this time.  We may consider retrying Botox  injections for migraine headaches but for now, I would like to ask her to pursue additional workup including a full, dilated eye examination and a sleep study to help rule out obstructive sleep apnea.  In addition, I would like for her to start working on lifestyle modification and tapering pramipexole  as it can cause exacerbation of restless leg syndrome including augmentation and lower extremity swelling.  I had a long discussion with the patient today.  Neurological exam is largely nonfocal.  This was a lengthy visit of over 60 minutes hide complexity, given copious record review involved, addressing multiple problems, and considerable counseling and coordination of care.    Below is a summary of my recommendations for this patient today based on today's visit.  She was given these instructions and recommendations verbally during the visit and also in her after visit summary in MyChart, which she can access electronically:   << Please remember, common headache triggers  are: sleep deprivation, dehydration, overheating, stress, hypoglycemia or skipping meals and blood sugar fluctuations, excessive pain medications or excessive alcohol use or  caffeine withdrawal. Some people have food triggers such as aged cheese, orange juice or chocolate, especially dark chocolate, or MSG (monosodium glutamate). Try to avoid these headache triggers as much possible. It may be helpful to keep a headache diary to figure out what makes your headaches worse or brings them on and what alleviates them. Some people report headache onset after exercise but studies have shown that regular exercise may actually prevent headaches from coming. If you have exercise-induced headaches, please make sure that you drink plenty of fluid before and after exercising and that you do not over do it and do not overheat. Please reduce and limit daily caffeine to 1-2 servings/day, 8 oz size each, as caffeine can drive headaches.  I will order a sleep study to look for signs of obstructive sleep apnea (aka OSA). As explained, the long-term risks and ramifications of untreated moderate to severe obstructive sleep apnea may include (but are not limited to): increased risk for cardiovascular disease, including congestive heart failure, stroke, difficult to control hypertension, treatment resistant obesity, arrhythmias, especially irregular heartbeat commonly known as A. Fib. (atrial fibrillation); even type 2 diabetes has been linked to untreated OSA.  For your IIH: I recommend restarting Diamox (generic name: acetazolamide) 500 mg strength: Take one pill each night at bedtime for 1 week, then 1 pill twice daily thereafter. Common side effects reported are: Dizziness, lightheadedness, increase in urine output, blurry vision, dry mouth, drowsiness, loss of appetite, upset stomach, headache and tiredness, tingling in the hands and feet and change in taste, especially with carbonated sodas. Most side effects are transient especially during the first few days as the body adjusts to the medication.   Please increase your water intake to 4 bottles per day 16.9 oz size each.  Make an appointment with  your optometrist for a full, dilated eye exam to make sure you have no eye nerve swelling, called papilledema.  We will plan a follow up in about 6 months.  I recommend you taper off your pramipexole  0.5 mg to 1/2 pill each evening for 2 weeks, then 1/2 pill every other day for 2 weeks, then stop. Your restless legs symptoms may improve with caffeine reduction. Your leg swelling may also improve.  We can consider retrying Botox  injections in the future. >>   Thank you very much for allowing me to participate in the care of this nice patient. If I can be of any further assistance to you please do not hesitate to call me at [phone].  Sincerely,   Debbra Fairy, MD, PhD

## 2023-07-17 NOTE — Patient Instructions (Addendum)
 It was nice to meet you today.   As discussed, your headaches are likely due to a combination of factors, including caffeine overuse, sleep disorder, migraine and IIH.   Here is what we discussed today and my recommendations for you:   Please remember, common headache triggers are: sleep deprivation, dehydration, overheating, stress, hypoglycemia or skipping meals and blood sugar fluctuations, excessive pain medications or excessive alcohol use or caffeine withdrawal. Some people have food triggers such as aged cheese, orange juice or chocolate, especially dark chocolate, or MSG (monosodium glutamate). Try to avoid these headache triggers as much possible. It may be helpful to keep a headache diary to figure out what makes your headaches worse or brings them on and what alleviates them. Some people report headache onset after exercise but studies have shown that regular exercise may actually prevent headaches from coming. If you have exercise-induced headaches, please make sure that you drink plenty of fluid before and after exercising and that you do not over do it and do not overheat. Please reduce and limit daily caffeine to 1-2 servings/day, 8 oz size each, as caffeine can drive headaches.  I will order a sleep study to look for signs of obstructive sleep apnea (aka OSA). As explained, the long-term risks and ramifications of untreated moderate to severe obstructive sleep apnea may include (but are not limited to): increased risk for cardiovascular disease, including congestive heart failure, stroke, difficult to control hypertension, treatment resistant obesity, arrhythmias, especially irregular heartbeat commonly known as A. Fib. (atrial fibrillation); even type 2 diabetes has been linked to untreated OSA.  For your IIH: I recommend restarting Diamox (generic name: acetazolamide) 500 mg strength: Take one pill each night at bedtime for 1 week, then 1 pill twice daily thereafter. Common side effects  reported are: Dizziness, lightheadedness, increase in urine output, blurry vision, dry mouth, drowsiness, loss of appetite, upset stomach, headache and tiredness, tingling in the hands and feet and change in taste, especially with carbonated sodas. Most side effects are transient especially during the first few days as the body adjusts to the medication.   Please increase your water intake to 4 bottles per day 16.9 oz size each.  Make an appointment with your optometrist for a full, dilated eye exam to make sure you have no eye nerve swelling, called papilledema.  We will plan a follow up in about 6 months.  I recommend you taper off your pramipexole  0.5 mg to 1/2 pill each evening for 2 weeks, then 1/2 pill every other day for 2 weeks, then stop. Your restless legs symptoms may improve with caffeine reduction. Your leg swelling may also improve.  We can consider retrying Botox  injections in the future.

## 2023-09-12 ENCOUNTER — Other Ambulatory Visit: Payer: Self-pay | Admitting: Neurology

## 2023-10-14 ENCOUNTER — Ambulatory Visit: Admitting: Neurology

## 2023-10-18 ENCOUNTER — Other Ambulatory Visit: Payer: Self-pay | Admitting: Neurology

## 2024-01-15 ENCOUNTER — Telehealth: Payer: Self-pay | Admitting: Adult Health

## 2024-01-15 NOTE — Telephone Encounter (Signed)
 LVM and sent mychart msg informing pt of appt time change on 01/20/24 - NP out

## 2024-01-20 ENCOUNTER — Ambulatory Visit: Admitting: Adult Health

## 2024-01-20 ENCOUNTER — Encounter: Payer: Self-pay | Admitting: Adult Health

## 2024-01-20 NOTE — Progress Notes (Deleted)
 [facility] [address] [place]. [place] [address] [phone]       OFFICE FOLLOW UP NOTE  [name] Date of Birth:  1975-06-20 Medical Record Number:  [id number]    Primary neurologist: Dr. Buck Reason for visit: Chronic migraines    SUBJECTIVE:  CHIEF COMPLAINT:  No chief complaint on file.   Follow-up visit:  Prior visit: 07/17/2023 Dr. Buck (initial consult visit)  Brief HPI:   Lacey Simon is a 48 y.o. female with PMH of IIH, migraine headaches, smoking, restless leg syndrome, anxiety, irritable bowel syndrome, arthritis, status post arthroscopic knee surgery, COPD, and reflux disease.  She was evaluated by Dr. Buck in 06/2023 for management of chronic migraines.  Previously followed by [facility] neurology and underwent 2 rounds of Botox  but did not feel these were helpful and wished to transfer care to this office.  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  On pramipexole  for RLS and recommended gradually tapering off due to possible worsening of RLS symptoms and lower extremity edema.    Interval history:           Previously tried/failed:  Pramipexole  Gabapentin  Diamox - peripheral neuropathy and RLS Topiramate - peripheral neuropathy and RLS Furosemide   Amitriptyline Zonisamide contraindicated due to anaphylactic reaction to sulfa drugs Botox   Sumatriptan Diclofenac  Ibuprofen  Benadryl Phenergan  Naproxen  Tramadol Hydrocodone  Tizanidine Compazine     ROS:   14 system review of systems performed and negative with exception of ***  PMH:  Past Medical History:  Diagnosis Date   Anxiety    COPD (chronic obstructive pulmonary disease) (HCC)     ETOH abuse    Fatigue    Hx of substance abuse (HCC)    IBS (irritable bowel syndrome)    Idiopathic intracranial hypertension    Lung nodule    Optic disc edema    Pseudotumor cerebri    Restless leg syndrome     PSH:  Past Surgical History:  Procedure Laterality Date   CESAREAN SECTION     CHOLECYSTECTOMY     DENTAL SURGERY     KNEE SURGERY Right 05/2023   meniscectomy, removed loose bone fragments, arthroscopy   lumbar punctures     x4-5, last February 2024   TUBAL LIGATION      Social History:  Social History   Socioeconomic History   Marital status: Married    Spouse name: Not on file   Number of children: Not on file   Years of education: Not on file   Highest education level: Not on file  Occupational History   Not on file  Tobacco Use   Smoking status: Every Day    Current packs/day: 0.50    Types: Cigarettes   Smokeless tobacco: Never  Vaping Use   Vaping status: Every Day  Substance and Sexual Activity   Alcohol use: Not Currently    Comment: occas, none since November 2024   Drug use: Never   Sexual activity: Not on file  Other Topics Concern   Not on file  Social History Narrative   Right handed   Caffeine: sweet tea and pepsi daily (1-2 glasses of tea, 1 pepsi)   Drinks 2-3 bottles of water each day and mixes  flavoring in   Social Drivers of Health   Financial Resource Strain: Low Risk  (03/29/2023)   Received from [facility]   Overall Financial Resource Strain (CARDIA)    Difficulty of Paying Living Expenses: Not very hard  Food Insecurity: No Food Insecurity (03/29/2023)   Received from [facility]   Hunger Vital Sign    Within the past 12 months, you worried that your food would run out before you got the money to buy more.: Never true    Within the past 12 months, the food you bought just didn't last and you didn't have money to get more.: Never true  Transportation Needs: No Transportation Needs (03/29/2023)   Received from [facility]    Lack of Transportation (Medical): No    Lack of Transportation (Non-Medical): No  Physical Activity: Not on file  Stress: Not on file  Social Connections: Not on file  Intimate Partner Violence: Not At Risk (05/31/2023)   Received from [facility]   Humiliation, Afraid, Rape, and Kick questionnaire    Within the last year, have you been afraid of your partner or ex-partner?: No    Within the last year, have you been humiliated or emotionally abused in other ways by your partner or ex-partner?: No    Within the last year, have you been kicked, hit, slapped, or otherwise physically hurt by your partner or ex-partner?: No    Within the last year, have you been raped or forced to have any kind of sexual activity by your partner or ex-partner?: No    Family History:  Family History  Problem Relation Age of Onset   Stroke Maternal Aunt    Dementia Maternal Uncle    Diabetes Other    Cancer Other    Heart Problems Other    Migraines Neg Hx    Pseudotumor cerebri Neg Hx     Medications:   Current Outpatient Medications on File Prior to Visit  Medication Sig Dispense Refill   albuterol (VENTOLIN HFA) 108 (90 Base) MCG/ACT inhaler Inhale 2 puffs into the lungs 2 (two) times daily.     budesonide-formoterol (SYMBICORT) 80-4.5 MCG/ACT inhaler Inhale 1 puff into the lungs daily.     diclofenac  (VOLTAREN ) 75 MG EC tablet Take 1 tablet (75 mg total) by mouth 2 (two) times daily as needed. 15 tablet 5   diphenhydrAMINE (BENADRYL) 25 mg capsule Take by mouth.     fluticasone (FLONASE) 50 MCG/ACT nasal spray Place 1 spray into both nostrils daily.     furosemide  (LASIX ) 20 MG tablet TAKE 1 TABLET BY MOUTH EVERY DAY 90 tablet 1   gabapentin  (NEURONTIN ) 300 MG capsule TAKE 2 CAPSULES BY MOUTH AT BEDTIME. 60 capsule 5   omeprazole (PRILOSEC) 40 MG capsule Take 1 capsule by mouth daily. (Patient not taking: Reported on 07/17/2023)     potassium chloride   (KLOR-CON ) 10 MEQ tablet Take 1 tablet (10 mEq total) by mouth daily. (Patient not taking: Reported on 07/17/2023) 30 tablet 5   pramipexole  (MIRAPEX ) 0.5 MG tablet TAKE 2 TABLETS (1 MG TOTAL) BY MOUTH EVERY EVENING. 180 tablet 0   promethazine  (PHENERGAN ) 25 MG tablet TAKE 1 TABLET BY MOUTH 2 TIMES DAILY AS NEEDED. 30 tablet 4   No current facility-administered medications on file prior to visit.    Allergies:   Allergies  Allergen Reactions   Bee Venom Anaphylaxis   Penicillins Shortness Of Breath   Sulfa Antibiotics Anaphylaxis and  Shortness Of Breath   Erythromycin Palpitations   Latex Swelling and Rash      OBJECTIVE:  Physical Exam  There were no vitals filed for this visit. There is no height or weight on file to calculate BMI. No results found.   General: well developed, well nourished, seated, in no evident distress Head: head normocephalic and atraumatic.   Neck: supple with no carotid or supraclavicular bruits Cardiovascular: regular rate and rhythm, no murmurs Musculoskeletal: no deformity Skin:  no rash/petichiae Vascular:  Normal pulses all extremities   Neurologic Exam Mental Status: Awake and fully alert. Oriented to place and time. Recent and remote memory intact. Attention span, concentration and fund of knowledge appropriate. Mood and affect appropriate.  Cranial Nerves: Pupils equal, briskly reactive to light. Extraocular movements full without nystagmus. Visual fields full to confrontation. Hearing intact. Facial sensation intact. Face, tongue, palate moves normally and symmetrically.  Motor: Normal bulk and tone. Normal strength in all tested extremity muscles Sensory.: intact to touch , pinprick , position and vibratory sensation.  Coordination: Rapid alternating movements normal in all extremities. Finger-to-nose and heel-to-shin performed accurately bilaterally. Gait and Station: Arises from chair without difficulty. Stance is normal. Gait  demonstrates normal stride length and balance with ***. Tandem walk and heel toe ***.  Reflexes: 1+ and symmetric. Toes downgoing.         ASSESSMENT/PLAN: Lacey Simon is a 48 y.o. year old female      *** :       Follow up in *** or call earlier if needed   CC:  PCP: Pcp, No    I personally spent a total of *** minutes in the care of the patient today including {Time Based Coding:210964241}.     Harlene Bogaert, AGNP-BC  [facility] [address] [place], [place] [address]  Phone [phone] Fax [phone] Note: This document was prepared with digital dictation and possible smart phrase technology. Any transcriptional errors that result from this process are unintentional.

## 2024-03-06 ENCOUNTER — Other Ambulatory Visit: Payer: Self-pay | Admitting: Physician Assistant

## 2024-03-06 DIAGNOSIS — Z1231 Encounter for screening mammogram for malignant neoplasm of breast: Secondary | ICD-10-CM

## 2024-03-10 ENCOUNTER — Ambulatory Visit: Admitting: Cardiology

## 2024-03-17 ENCOUNTER — Ambulatory Visit: Attending: Cardiology | Admitting: Cardiology

## 2024-03-17 ENCOUNTER — Ambulatory Visit

## 2024-03-17 VITALS — BP 124/76 | HR 75 | Ht 63.0 in | Wt 153.0 lb

## 2024-03-17 DIAGNOSIS — R002 Palpitations: Secondary | ICD-10-CM

## 2024-03-17 DIAGNOSIS — R0601 Orthopnea: Secondary | ICD-10-CM | POA: Diagnosis not present

## 2024-03-17 DIAGNOSIS — I872 Venous insufficiency (chronic) (peripheral): Secondary | ICD-10-CM | POA: Diagnosis not present

## 2024-03-17 DIAGNOSIS — R0602 Shortness of breath: Secondary | ICD-10-CM

## 2024-03-17 DIAGNOSIS — R072 Precordial pain: Secondary | ICD-10-CM

## 2024-03-17 DIAGNOSIS — Z79899 Other long term (current) drug therapy: Secondary | ICD-10-CM

## 2024-03-17 LAB — CBC

## 2024-03-17 LAB — LIPID PANEL

## 2024-03-17 MED ORDER — METOPROLOL TARTRATE 50 MG PO TABS: 50.0000 mg | ORAL_TABLET | ORAL | 0 refills | Status: AC

## 2024-03-17 NOTE — Patient Instructions (Signed)
 Medication Instructions:  The current medical regimen is effective;  continue present plan and medications.  *If you need a refill on your cardiac medications before your next appointment, please call your pharmacy*  Lab Work: Please have blood work today (CBC, CMP, pro BNP, Lipid and LPa)  If you have labs (blood work) drawn today and your tests are completely normal, you will receive your results only by: MyChart Message (if you have MyChart) OR A paper copy in the mail If you have any lab test that is abnormal or we need to change your treatment, we will call you to review the results.  Testing/Procedures: Your physician has requested that you have an echocardiogram. Echocardiography is a painless test that uses sound waves to create images of your heart. It provides your doctor with information about the size and shape of your heart and how well your hearts chambers and valves are working. This procedure takes approximately one hour. There are no restrictions for this procedure. Please do NOT wear cologne, perfume, aftershave, or lotions (deodorant is allowed). Please arrive 15 minutes prior to your appointment time.  Please note: We ask at that you not bring children with you during ultrasound (echo/ vascular) testing. Due to room size and safety concerns, children are not allowed in the ultrasound rooms during exams. Our front office staff cannot provide observation of children in our lobby area while testing is being conducted. An adult accompanying a patient to their appointment will only be allowed in the ultrasound room at the discretion of the ultrasound technician under special circumstances. We apologize for any inconvenience.    Your cardiac CT will be scheduled at: [facility] [address]  [place], [place] [address]  Please enter the parking lot using the [address] entrance and use the FREE valet service at the patient drop-off area.  Enter the building and check-in with registration on the main floor.  Please follow these instructions carefully (unless otherwise directed):  An IV will be required for this test and Nitroglycerin will be given.  Hold all erectile dysfunction medications at least 3 days (72 hrs) prior to test. (Ie viagra, cialis, sildenafil, tadalafil, etc)   On the Night Before the Test: Be sure to Drink plenty of water. Do not consume any caffeinated/decaffeinated beverages or chocolate 12 hours prior to your test. Do not take any antihistamines 12 hours prior to your test.  On the Day of the Test: Drink plenty of water until 1 hour prior to the test. Do not eat any food 1 hour prior to test. You may take your regular medications prior to the test.  Take metoprolol  (Lopressor ) two hours prior to test. If you take Furosemide /Hydrochlorothiazide/Spironolactone/Chlorthalidone, please HOLD on the morning of the test. Patients who wear a continuous glucose monitor MUST remove the device prior to scanning. FEMALES- please wear underwire-free bra if available, avoid dresses & tight clothing       After the Test: Drink plenty of water. After receiving IV contrast, you may experience a mild flushed feeling. This is normal. On occasion, you may experience a mild rash up to 24 hours after the test. This is not dangerous. If this occurs, you can take Benadryl 25 mg, Zyrtec, Claritin, or Allegra and increase your fluid intake. (Patients taking Tikosyn should avoid Benadryl, and may take Zyrtec, Claritin, or Allegra) If you experience trouble breathing, this can be serious. If it is severe call 911 IMMEDIATELY. If it is mild, please  call our office.  We will call to schedule your test 2-4 weeks out understanding that some insurance companies will need an authorization prior to the service being performed.   For more information and frequently asked questions, please visit our website :  [email or web address]  For non-scheduling related questions, please contact the cardiac imaging nurse navigator should you have any questions/concerns: Cardiac Imaging Nurse Navigators Direct Office Dial: [phone]   For scheduling needs, including cancellations and rescheduling, please call Brittany, [phone].  ZIO XT- Long Term Monitor Instructions  Your physician has requested you wear a ZIO patch monitor for 14 days.  This is a single patch monitor. [employer] supplies one patch monitor per enrollment. Additional stickers are not available. Please do not apply patch if you will be having a Nuclear Stress Test,  Echocardiogram, Cardiac CT, MRI, or Chest Xray during the period you would be wearing the  monitor. The patch cannot be worn during these tests. You cannot remove and re-apply the  ZIO XT patch monitor.  Your ZIO patch monitor will be mailed 3 day USPS to your address on file. It may take 3-5 days  to receive your monitor after you have been enrolled.  Once you have received your monitor, please review the enclosed instructions. Your monitor  has already been registered assigning a specific monitor serial # to you.  Billing and Patient Assistance Program Information  We have supplied [employer] with any of your insurance information on file for billing purposes. [employer] offers a sliding scale Patient Assistance Program for patients that do not have  insurance, or whose insurance does not completely cover the cost of the ZIO monitor.  You must apply for the Patient Assistance Program to qualify for this discounted rate.  To apply, please call [employer] at [phone], select option 4, select option 2, ask to apply for  Patient Assistance Program. Meredeth will ask your household income, and how many people  are in your household. They will quote your out-of-pocket cost based on that information.  [employer] will also be able to set up a 24-month, interest-free payment  plan if needed.  Applying the monitor   Shave hair from upper left chest.  Hold abrader disc by orange tab. Rub abrader in 40 strokes over the upper left chest as  indicated in your monitor instructions.  Clean area with 4 enclosed alcohol pads. Let dry.  Apply patch as indicated in monitor instructions. Patch will be placed under collarbone on left  side of chest with arrow pointing upward.  Rub patch adhesive wings for 2 minutes. Remove white label marked 1. Remove the white  label marked 2. Rub patch adhesive wings for 2 additional minutes.  While looking in a mirror, press and release button in center of patch. A small green light will  flash 3-4 times. This will be your only indicator that the monitor has been turned on.  Do not shower for the first 24 hours. You may shower after the first 24 hours.  Press the button if you feel a symptom. You will hear a small click. Record Date, Time and  Symptom in the Patient Logbook.  When you are ready to remove the patch, follow instructions on the last 2 pages of Patient  Logbook. Stick patch monitor onto the last page of Patient Logbook.  Place Patient Logbook in the blue and white box. Use locking tab on box and tape box closed  securely. The blue and white box has prepaid postage on it.  Please place it in the mailbox as  soon as possible. Your physician should have your test results approximately 7 days after the  monitor has been mailed back to [facility].  Call [facility] Customer Care at [phone] if you have questions regarding  your ZIO XT patch monitor. Call them immediately if you see an orange light blinking on your  monitor.  If your monitor falls off in less than 4 days, contact our Monitor department at [phone].  If your monitor becomes loose or falls off after 4 days call [employer] at [phone] for  suggestions on securing your monitor  You have been referred to Vascular and Vein for further evaluation  and treatment of venous insuffiencey.  Follow-Up: At [facility], you and your health needs are our priority.  As part of our continuing mission to provide you with exceptional heart care, our providers are all part of one team.  This team includes your primary Cardiologist (physician) and Advanced Practice Providers or APPs (Physician Assistants and Nurse Practitioners) who all work together to provide you with the care you need, when you need it.  Your next appointment:   Follow up will be based on the results of the above testing.

## 2024-03-17 NOTE — Progress Notes (Signed)
 " Cardiology Office Note:  .   Date:  03/17/2024  ID:  Lacey Simon, DOB Oct 12, 1975, MRN [id number] PCP: Lacey Simon  [facility] Providers Cardiologist:  None     History of Present Illness: Lacey Simon is a 49 y.o. female Discussed the use of AI scribe   History of Present Illness Lacey Simon is a 49 year old female with hyperlipidemia and COPD who presents with heart palpitations, chest discomfort.  She experiences heart palpitations described as 'little butterflies' and a sensation of her heart beating out of her chest. These episodes occur randomly, including when lying down, and have been ongoing for years but are becoming more frequent. A specific episode during Christmas was accompanied by mild chest discomfort and lightheadedness.   She has a history of hyperlipidemia and is currently taking rosuvastatin 5 mg. She also has COPD with asthma, diagnosed on March 15, 2023, and is a smoker, which may contribute to her symptoms. She experiences shortness of breath with minimal exertion, such as walking to her car, and has been out of work for two weeks due to illness, which exacerbated her COPD symptoms.  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 She also has anxiety and depression, and uses delta eight vape as needed for symptom relief. She is on furosemide  20 mg.  She mentions a past EKG that showed nonspecific ST changes and recalls previous EKGs indicating ischemia. She also reports a history of a large right atrium noted by a primary doctor.  Her family history is significant for cardiovascular issues, including heart attacks in both grandfathers and multiple strokes in her grandmother. Her aunt died from a brain aneurysm, and there is a history of cancer in her family.  Her prior cardiologist in [place]  performed bilateral saphenous vein ablation procedure.  She  states it was the first stage of the procedure then COVID hit and she did not get the second stage.  She would like to see somebody here.    ROS: Occasional lower extremity edema when standing  Studies Reviewed: Lacey   EKG Interpretation Date/Time:  Tuesday March 17 2024 09:40:45 EST Ventricular Rate:  75 PR Interval:  124 QRS Duration:  86 QT Interval:  370 QTC Calculation: 413 R Axis:   57  Text Interpretation: Normal sinus rhythm Nonspecific ST abnormality No previous ECGs available Confirmed by Jeffrie Anes ([id number]) on 03/17/2024 9:52:00 AM    Results Diagnostic EKG (03/17/2024): Nonspecific ST changes Risk Assessment/Calculations:            Physical Exam:   VS:  BP 124/76 (BP Location: Right Arm, Patient Position: Sitting, Cuff Size: Normal)   Pulse 75   Ht 5' 3 (1.6 m)   Wt 153 lb (69.4 kg)   SpO2 96%   BMI 27.10 kg/m    Wt Readings from Last 3 Encounters:  03/17/24 153 lb (69.4 kg)  07/17/23 154 lb (69.9 kg)  10/02/22 138 lb (62.6 kg)    GEN: Well nourished, well developed in no acute distress NECK: No JVD; No carotid bruits CARDIAC: RRR, no murmurs, no rubs, no gallops RESPIRATORY:  Clear to auscultation without rales, wheezing or rhonchi  ABDOMEN: Soft, non-tender, non-distended EXTREMITIES: Trace lower extremity edema; No deformity   ASSESSMENT AND PLAN: .    Assessment and Plan Assessment & Plan Palpitations Intermittent palpitations with a sensation of heart racing and skipped beats, increasing  in frequency. EKG shows nonspecific ST changes, not alarming. Previous EKGs indicated ischemia. Family history of heart disease. Differential includes arrhythmia and ischemia. Symptoms include chest discomfort and lightheadedness, with a history of mild chest pressure and lightheadedness during episodes. - Ordered Zio monitor for heart rhythm assessment - Ordered echocardiogram to evaluate for heart failure or myocardial weakening - Ordered coronary CT scan to  assess for coronary artery stenosis or blockages - Ordered blood work to check levels and ensure safety  Chronic venous insufficiency Bilateral leg swelling, worse in the right leg, exacerbated by prolonged standing. Previous laparoscopic vein surgery was incomplete due to COVID-19 pandemic. Symptoms include significant swelling by midday if standing for extended periods. - Referred to vascular vein specialists for evaluation and management of venous insufficiency, perhaps she needs further ablative therapy.  Tobacco use COPD - Continue to work on cessation discussed.           Dispo: Follow-up with results of studies  Signed, Oneil Parchment, MD   "

## 2024-03-17 NOTE — Progress Notes (Unsigned)
 Enrolled patient for a 14 day Zio XT  monitor to be mailed to patients home

## 2024-03-18 ENCOUNTER — Ambulatory Visit: Payer: Self-pay | Admitting: Cardiology

## 2024-03-18 LAB — COMPREHENSIVE METABOLIC PANEL WITH GFR
ALT: 13 IU/L (ref 0–32)
AST: 13 IU/L (ref 0–40)
Albumin: 4.1 g/dL (ref 3.9–4.9)
Alkaline Phosphatase: 100 IU/L (ref 41–116)
BUN/Creatinine Ratio: 7 — AB (ref 9–23)
BUN: 6 mg/dL (ref 6–24)
Bilirubin Total: 0.3 mg/dL (ref 0.0–1.2)
CO2: 22 mmol/L (ref 20–29)
Calcium: 8.4 mg/dL — AB (ref 8.7–10.2)
Chloride: 100 mmol/L (ref 96–106)
Creatinine, Ser: 0.87 mg/dL (ref 0.57–1.00)
Globulin, Total: 2.4 g/dL (ref 1.5–4.5)
Glucose: 76 mg/dL (ref 70–99)
Potassium: 3.6 mmol/L (ref 3.5–5.2)
Sodium: 138 mmol/L (ref 134–144)
Total Protein: 6.5 g/dL (ref 6.0–8.5)
eGFR: 82 mL/min/1.73

## 2024-03-18 LAB — LIPID PANEL
Cholesterol, Total: 220 mg/dL — AB (ref 100–199)
HDL: 52 mg/dL
LDL CALC COMMENT:: 4.2 ratio (ref 0.0–4.4)
LDL Chol Calc (NIH): 124 mg/dL — AB (ref 0–99)
Triglycerides: 249 mg/dL — AB (ref 0–149)
VLDL Cholesterol Cal: 44 mg/dL — AB (ref 5–40)

## 2024-03-18 LAB — LIPOPROTEIN A (LPA): Lipoprotein (a): <8.4 nmol/L

## 2024-03-18 LAB — PRO B NATRIURETIC PEPTIDE: NT-Pro BNP: 119 pg/mL (ref 0–249)

## 2024-03-18 LAB — CBC
Hematocrit: 41.2 % (ref 34.0–46.6)
Hemoglobin: 13.6 g/dL (ref 11.1–15.9)
MCH: 30.3 pg (ref 26.6–33.0)
MCHC: 33 g/dL (ref 31.5–35.7)
MCV: 92 fL (ref 79–97)
Platelets: 300 x10E3/uL (ref 150–450)
RBC: 4.49 x10E6/uL (ref 3.77–5.28)
RDW: 13.4 % (ref 11.7–15.4)
WBC: 14.8 x10E3/uL — AB (ref 3.4–10.8)

## 2024-03-24 ENCOUNTER — Other Ambulatory Visit: Payer: Self-pay | Admitting: Medical Genetics

## 2024-03-26 ENCOUNTER — Encounter (HOSPITAL_COMMUNITY): Payer: Self-pay

## 2024-03-26 ENCOUNTER — Ambulatory Visit

## 2024-03-29 ENCOUNTER — Other Ambulatory Visit: Payer: Self-pay | Admitting: Neurology

## 2024-03-30 ENCOUNTER — Ambulatory Visit (HOSPITAL_COMMUNITY)

## 2024-04-09 ENCOUNTER — Other Ambulatory Visit

## 2024-04-10 ENCOUNTER — Ambulatory Visit

## 2024-04-10 ENCOUNTER — Ambulatory Visit (HOSPITAL_COMMUNITY)

## 2024-04-20 ENCOUNTER — Ambulatory Visit (HOSPITAL_COMMUNITY)

## 2024-05-01 ENCOUNTER — Ambulatory Visit: Admitting: Internal Medicine

## 2024-07-03 ENCOUNTER — Ambulatory Visit (HOSPITAL_COMMUNITY)

## 2024-07-03 ENCOUNTER — Encounter
